# Patient Record
Sex: Female | Born: 1985 | Hispanic: Yes | Marital: Married | Smoking: Never smoker
Health system: Southern US, Community
[De-identification: ages and names within clinical notes are randomized; demographics above are authoritative.]

## PROBLEM LIST (undated history)

## (undated) DIAGNOSIS — Z789 Other specified health status: Secondary | ICD-10-CM

## (undated) HISTORY — PX: NO PAST SURGERIES: SHX2092

## (undated) HISTORY — DX: Other specified health status: Z78.9

---

## 2008-07-28 DIAGNOSIS — A549 Gonococcal infection, unspecified: Secondary | ICD-10-CM

## 2008-07-28 HISTORY — DX: Gonococcal infection, unspecified: A54.9

## 2011-07-29 HISTORY — PX: LEEP: SHX91

## 2019-07-20 LAB — OB RESULTS CONSOLE HGB/HCT, BLOOD
HCT: 39 (ref 29–41)
Hemoglobin: 13.3

## 2019-07-20 LAB — OB RESULTS CONSOLE ABO/RH: RH Type: POSITIVE

## 2019-07-20 LAB — OB RESULTS CONSOLE ANTIBODY SCREEN: Antibody Screen: NEGATIVE

## 2019-07-20 LAB — OB RESULTS CONSOLE RUBELLA ANTIBODY, IGM: Rubella: IMMUNE

## 2019-07-20 LAB — OB RESULTS CONSOLE RPR: RPR: NONREACTIVE

## 2019-07-20 LAB — OB RESULTS CONSOLE PLATELET COUNT: Platelets: 271

## 2019-07-20 LAB — OB RESULTS CONSOLE HEPATITIS B SURFACE ANTIGEN: Hepatitis B Surface Ag: NEGATIVE

## 2019-07-29 NOTE — L&D Delivery Note (Addendum)
OB/GYN Faculty Practice Delivery Note  Kathryn Mack is a 34 y.o. G1P0 s/p vaginal delivery at [redacted]w[redacted]d. She was admitted for SROM- 0700 on 6:30.  ROM: 26h 77m with clear fluid GBS Status: negative Maximum Maternal Temperature: 101.9*F  Labor Progress: . Admitted on 01/25/20 after SROM at 0700. Expectant management with aggressive labor position changes per patient request- she initially wanted a water birth. After >12 hours without cervical change, she was amenable to augmentation with cytotec and FB. She was eventually started on pitocin and received an epidural. She progressed to complete and delivered after a short second stage, developing Triple I just prior to delivery.  Delivery Date/Time: 01/26/20, 1062 Delivery: Called to room and patient was complete and pushing. Head delivered ROA. No nuchal cord present. Shoulder and body delivered in usual fashion. Infant with spontaneous cry, placed on mother's abdomen, dried and stimulated. Cord clamped x 2 after pulsations stopped, and cut by [name] under my direct supervision. Cord blood drawn. Placenta delivered spontaneously with gentle cord traction. Fundus firm with massage and Pitocin. Labia, perineum, vagina, and cervix were inspected, and a first degree laceration was noted-repaired with 3-0 Monocryl in the usual fashion.   Placenta: 3 vessel cord, intact, to pathology for suspected chorio Complications: Baby initial temp 102 Lacerations: 1st degree laceration- repaired with 3-0 Monocryl in the usual fashion EBL: 223 mL Analgesia: epidural  Postpartum Planning [x]  message to sent to schedule follow-up  [x]  vaccines UTD  Infant: female  APGARs 9,9  weight per medical record  , DO OB/GYN Fellow, Faculty Practice

## 2019-12-23 LAB — OB RESULTS CONSOLE HGB/HCT, BLOOD
HCT: 36 (ref 29–41)
Hemoglobin: 12.6

## 2019-12-23 LAB — HIV ANTIBODY (ROUTINE TESTING W REFLEX): HIV Screen 4th Generation wRfx: NONREACTIVE

## 2019-12-23 LAB — OB RESULTS CONSOLE PLATELET COUNT: Platelets: 217

## 2020-01-04 ENCOUNTER — Encounter: Payer: Self-pay | Admitting: General Practice

## 2020-01-09 ENCOUNTER — Encounter: Payer: Self-pay | Admitting: General Practice

## 2020-01-09 ENCOUNTER — Encounter: Payer: Self-pay | Admitting: *Deleted

## 2020-01-09 DIAGNOSIS — Z3400 Encounter for supervision of normal first pregnancy, unspecified trimester: Secondary | ICD-10-CM | POA: Insufficient documentation

## 2020-01-09 HISTORY — DX: Encounter for supervision of normal first pregnancy, unspecified trimester: Z34.00

## 2020-01-09 LAB — AFP TETRA
AFP MoM: 2.06
AFP: NEGATIVE
Down Syndrome: 1
Open Spina bifida: 1

## 2020-01-09 LAB — DIET GESTATIONAL CARB MOD: GTT, 1 hr: 137

## 2020-01-18 ENCOUNTER — Other Ambulatory Visit: Payer: Self-pay

## 2020-01-18 ENCOUNTER — Other Ambulatory Visit (HOSPITAL_COMMUNITY)
Admission: RE | Admit: 2020-01-18 | Discharge: 2020-01-18 | Disposition: A | Discharge status: Home / Self Care | Payer: BLUE CROSS/BLUE SHIELD | Source: Ambulatory Visit | Attending: Advanced Practice Midwife | Admitting: Advanced Practice Midwife

## 2020-01-18 ENCOUNTER — Ambulatory Visit (INDEPENDENT_AMBULATORY_CARE_PROVIDER_SITE_OTHER): Payer: BLUE CROSS/BLUE SHIELD | Admitting: Advanced Practice Midwife

## 2020-01-18 ENCOUNTER — Encounter: Payer: Self-pay | Admitting: Advanced Practice Midwife

## 2020-01-18 DIAGNOSIS — Z3403 Encounter for supervision of normal first pregnancy, third trimester: Secondary | ICD-10-CM | POA: Diagnosis not present

## 2020-01-18 DIAGNOSIS — Z3A36 36 weeks gestation of pregnancy: Secondary | ICD-10-CM | POA: Diagnosis not present

## 2020-01-18 DIAGNOSIS — Z3400 Encounter for supervision of normal first pregnancy, unspecified trimester: Secondary | ICD-10-CM | POA: Diagnosis not present

## 2020-01-18 DIAGNOSIS — Z23 Encounter for immunization: Secondary | ICD-10-CM | POA: Diagnosis not present

## 2020-01-18 NOTE — Progress Notes (Signed)
  List of peds provider tdap today

## 2020-01-18 NOTE — Progress Notes (Signed)
  Subjective:   Kathryn Mack is a 34 y.o. G1P0 at [redacted]w[redacted]d by LMP, midtrimester ultrasound being seen today for her first obstetrical visit.  Her obstetrical history is significant for none. Patient does intend to breast feed. Pregnancy history fully reviewed.  Patient reports no complaints.  HISTORY: OB History  Gravida Para Term Preterm AB Living  1 0 0 0 0 0  SAB TAB Ectopic Multiple Live Births  0 0 0 0 0    # Outcome Date GA Lbr Len/2nd Weight Sex Delivery Anes PTL Lv  1 Current             Last pap smear was done unknown and was unknown. Patient did have LEEP in 2013   Past Medical History:  Diagnosis Date  . Medical history non-contributory    Past Surgical History:  Procedure Laterality Date  . LEEP  2013   History reviewed. No pertinent family history. Social History   Tobacco Use  . Smoking status: Never Smoker  . Smokeless tobacco: Never Used  Vaping Use  . Vaping Use: Never used  Substance Use Topics  . Alcohol use: Not Currently  . Drug use: Never   No Known Allergies No current outpatient medications on file prior to visit.   No current facility-administered medications on file prior to visit.    Review of Systems Pertinent items noted in HPI and remainder of comprehensive ROS otherwise negative.  Exam   Vitals:   01/09/20 0935 01/18/20 1036  BP:  132/84  Pulse:  (!) 101  Weight:  179 lb 9.6 oz (81.5 kg)  Height: 5\' 4"  (1.626 m)    Fetal Heart Rate (bpm): 126  Physical Exam Vitals and nursing note reviewed. Exam conducted with a chaperone present.  Constitutional:      Appearance: Normal appearance.  HENT:     Head: Normocephalic.  Cardiovascular:     Rate and Rhythm: Normal rate.  Pulmonary:     Effort: Pulmonary effort is normal.  Skin:    General: Skin is warm and dry.  Neurological:     Mental Status: She is alert and oriented to person, place, and time.  Psychiatric:        Mood and Affect: Mood normal.   FH 36 cm CERVIX:  1/70/-2  Assessment:   Pregnancy: G1P0 Patient Active Problem List   Diagnosis Date Noted  . Supervision of normal first pregnancy, antepartum 01/09/2020     Plan:  1. Supervision of normal first pregnancy, antepartum - Culture, beta strep (group b only) - GC/Chlamydia probe amp ([place])not at [facility] - Hepatitis C Antibody - Info given for water birth class. Will have her sign consent at next visit if she is able to do a phone class with [facility]. CNM visit today, and patient it ok for water birth pending class.    Initial labs reviewed Hep C added  Continue prenatal vitamins. Genetic Screening discussed, Quad screen: results reviewed. Ultrasound discussed; fetal anatomic survey: results reviewed. Problem list reviewed and updated. The nature of [place] [facility] Faculty Practice with multiple MDs and other Advanced Practice Providers was explained to patient; also emphasized that residents, students are part of our team. Routine obstetric precautions reviewed. 50% of 45 min visit spent in counseling and coordination of care. Return in about 1 week (around 01/25/2020).   01/27/2020 DNP, CNM  01/18/20  11:21 AM

## 2020-01-18 NOTE — Patient Instructions (Addendum)
Considering Waterbirth? Guide for patients at [facility] [employer] [facility] Why consider waterbirth? . Gentle birth for babies  . Less pain medicine used in labor  . May allow for passive descent/less pushing  . May reduce perineal tears  . More mobility and instinctive maternal position changes  . Increased maternal relaxation  . Reduced blood pressure in labor   Is waterbirth safe? What are the risks of infection, drowning or other complications? . Infection:  Marland Kitchen Very low risk (3.7 % for tub vs 4.8% for bed)  . 7 in 8000 waterbirths with documented infection  . Poorly cleaned equipment most common cause  . Slightly lower group B strep transmission rate  . Drowning  . Maternal:  . Very low risk  . Related to seizures or fainting  . Newborn:  Marland Kitchen Very low risk. No evidence of increased risk of respiratory problems in multiple large studies  . Physiological protection from breathing under water  . Avoid underwater birth if there are any fetal complications  . Once baby's head is out of the water, keep it out.  . Birth complication  . Some reports of cord trauma, but risk decreased by bringing baby to surface gradually  . No evidence of increased risk of shoulder dystocia. Mothers can usually change positions faster in water than in a bed, possibly aiding the maneuvers to free the shoulder.  ? You must attend a Waterbirth class at [employer] at [facility] . 3rd Wednesday of every month from 7-9pm  . Free  . Register by calling [phone] Paulene Floor  . Bring [place] the certificate from the class to your prenatal appointment  Meet with a midwife at 36 weeks to see if you can still plan a waterbirth and to sign the consent.   If you plan a waterbirth at [facility], you can opt to purchase the following: . Fish Net . Bathing suit top (optional)  . Long-handled mirror (optional)  .  Things that would prevent you from having a  waterbirth: . Unknown or Positive COVID-19 diagnosis upon admission to hospital  . Premature, <37wks  . Previous cesarean birth  . Presence of thick meconium-stained fluid  . Multiple gestation (Twins, triplets, etc.)  . Uncontrolled diabetes or gestational diabetes requiring medication  . Hypertension requiring medication or diagnosis of pre-eclampsia  . Heavy vaginal bleeding  . Non-reassuring fetal heart rate  . Active infection (MRSA, etc.). Group B Strep is NOT a contraindication for waterbirth.  . If your labor has to be induced and induction method requires continuous monitoring of the baby's heart rate  . Other risks/issues identified by your obstetrical provider  Please remember that birth is unpredictable. Under certain unforeseeable circumstances your provider may advise against giving birth in the tub. These decisions will be made on a case-by-case basis and with the safety of you and your baby as our highest priority.  **Please remember that in order to have a waterbirth, you must test Negative to COVID-19 upon admission to the hospital.**

## 2020-01-19 LAB — GC/CHLAMYDIA PROBE AMP (~~LOC~~) NOT AT ARMC
Chlamydia: NEGATIVE
Comment: NEGATIVE
Comment: NORMAL
Neisseria Gonorrhea: NEGATIVE

## 2020-01-19 LAB — HEPATITIS C ANTIBODY: Hep C Virus Ab: <0.1 s/co ratio (ref 0.0–0.9)

## 2020-01-22 LAB — CULTURE, BETA STREP (GROUP B ONLY): Strep Gp B Culture: NEGATIVE

## 2020-01-25 ENCOUNTER — Inpatient Hospital Stay (HOSPITAL_COMMUNITY)
Admission: AD | Admit: 2020-01-25 | Discharge: 2020-01-28 | DRG: 805 | Disposition: A | Discharge status: Home / Self Care | Payer: BLUE CROSS/BLUE SHIELD | Attending: Obstetrics and Gynecology | Admitting: Obstetrics and Gynecology

## 2020-01-25 ENCOUNTER — Encounter (HOSPITAL_COMMUNITY): Payer: Self-pay | Admitting: Obstetrics and Gynecology

## 2020-01-25 ENCOUNTER — Other Ambulatory Visit: Payer: Self-pay

## 2020-01-25 ENCOUNTER — Telehealth: Payer: Self-pay | Admitting: *Deleted

## 2020-01-25 ENCOUNTER — Encounter: Payer: BLUE CROSS/BLUE SHIELD | Admitting: Student

## 2020-01-25 DIAGNOSIS — Z3A37 37 weeks gestation of pregnancy: Secondary | ICD-10-CM

## 2020-01-25 DIAGNOSIS — O411230 Chorioamnionitis, third trimester, not applicable or unspecified: Secondary | ICD-10-CM | POA: Diagnosis present

## 2020-01-25 DIAGNOSIS — Z3400 Encounter for supervision of normal first pregnancy, unspecified trimester: Secondary | ICD-10-CM

## 2020-01-25 DIAGNOSIS — Z20822 Contact with and (suspected) exposure to covid-19: Secondary | ICD-10-CM | POA: Diagnosis present

## 2020-01-25 DIAGNOSIS — O4202 Full-term premature rupture of membranes, onset of labor within 24 hours of rupture: Secondary | ICD-10-CM | POA: Diagnosis not present

## 2020-01-25 DIAGNOSIS — O26893 Other specified pregnancy related conditions, third trimester: Secondary | ICD-10-CM | POA: Diagnosis not present

## 2020-01-25 DIAGNOSIS — O4292 Full-term premature rupture of membranes, unspecified as to length of time between rupture and onset of labor: Secondary | ICD-10-CM | POA: Diagnosis not present

## 2020-01-25 HISTORY — DX: Full-term premature rupture of membranes, unspecified as to length of time between rupture and onset of labor: O42.92

## 2020-01-25 LAB — TYPE AND SCREEN
ABO/RH(D): O POS
Antibody Screen: NEGATIVE

## 2020-01-25 LAB — SARS CORONAVIRUS 2 BY RT PCR (HOSPITAL ORDER, PERFORMED IN ~~LOC~~ HOSPITAL LAB): SARS Coronavirus 2: NEGATIVE

## 2020-01-25 LAB — CBC
HCT: 38.4 % (ref 36.0–46.0)
Hemoglobin: 13 g/dL (ref 12.0–15.0)
MCH: 30.8 pg (ref 26.0–34.0)
MCHC: 33.9 g/dL (ref 30.0–36.0)
MCV: 91 fL (ref 80.0–100.0)
Platelets: 223 10*3/uL (ref 150–400)
RBC: 4.22 MIL/uL (ref 3.87–5.11)
RDW: 13.2 % (ref 11.5–15.5)
WBC: 9 10*3/uL (ref 4.0–10.5)
nRBC: 0 % (ref 0.0–0.2)

## 2020-01-25 LAB — POCT FERN TEST: POCT Fern Test: POSITIVE

## 2020-01-25 LAB — ABO/RH: ABO/RH(D): O POS

## 2020-01-25 LAB — RPR: RPR Ser Ql: NONREACTIVE

## 2020-01-25 MED ORDER — ACETAMINOPHEN 325 MG PO TABS: 650.0000 mg | ORAL_TABLET | ORAL | Status: DC | PRN

## 2020-01-25 MED ORDER — MISOPROSTOL 50MCG HALF TABLET
ORAL_TABLET | ORAL | Status: AC
  Filled 2020-01-25: qty 1

## 2020-01-25 MED ORDER — LACTATED RINGERS IV SOLN: INTRAVENOUS | Status: DC

## 2020-01-25 MED ORDER — OXYTOCIN-SODIUM CHLORIDE 30-0.9 UT/500ML-% IV SOLN: 2.5000 [IU]/h | INTRAVENOUS | Status: DC

## 2020-01-25 MED ORDER — OXYTOCIN BOLUS FROM INFUSION
333.0000 mL | Freq: Once | INTRAVENOUS | Status: AC
  Administered 2020-01-26: 333 mL via INTRAVENOUS

## 2020-01-25 MED ORDER — SOD CITRATE-CITRIC ACID 500-334 MG/5ML PO SOLN: 30.0000 mL | ORAL | Status: DC | PRN

## 2020-01-25 MED ORDER — ZOLPIDEM TARTRATE 5 MG PO TABS: 5.0000 mg | ORAL_TABLET | Freq: Every evening | ORAL | Status: DC | PRN

## 2020-01-25 MED ORDER — ONDANSETRON HCL 4 MG/2ML IJ SOLN
4.0000 mg | Freq: Four times a day (QID) | INTRAMUSCULAR | Status: DC | PRN
  Filled 2020-01-25: qty 2

## 2020-01-25 MED ORDER — LACTATED RINGERS IV SOLN: 500.0000 mL | INTRAVENOUS | Status: DC | PRN

## 2020-01-25 MED ORDER — LIDOCAINE HCL (PF) 1 % IJ SOLN: 30.0000 mL | INTRAMUSCULAR | Status: DC | PRN

## 2020-01-25 MED ORDER — MISOPROSTOL 50MCG HALF TABLET
50.0000 ug | ORAL_TABLET | Freq: Once | ORAL | Status: AC
  Administered 2020-01-25: 50 ug via BUCCAL

## 2020-01-25 NOTE — Progress Notes (Signed)
Patient ID: Kathryn Mack, female   DOB: November 10, 1985, 34 y.o.   MRN: [id number]  In to introduce myself to pt; she is wanting to discuss plan of care; states she is open to various IOL/augmentation methods as well as pain management options in addition to still considering waterbirth depending on how she feels  BP 135/89, 111/73, P 93 FHR 130s, +accels, no decels Ctx irreg, mild Cx 1/80/vtx -2; leaking clear fluid  IUP@37 .2wks PROM x 14h GBS neg Cx unfavorable  -Reviewed various methods for IOL/augmentation prior to exam> cx was still unfavorable and so she agreed to a cervical foley placement & buccal cytotec -Will reeval in 4hrs; if foley still in place will give a 2nd dose of cytotec -Discussed that if Pitocin is required for labor, she will not be able to get in the tub, but if we can keep labor going spontaneously, then waterbirth is still an option -Anticipate vag del  Arabella Merles CNM 01/25/2020

## 2020-01-25 NOTE — Progress Notes (Signed)
Kathryn Mack is a 34 y.o. G1P0 at [redacted]w[redacted]d   Subjective: Denies pain, just finished lunch. Verbalizes to CNM she feels ready to get out of bed. Continues to desire unmedicated labor and waterbirth.  Objective: BP 131/80   Pulse (!) 118   Temp 98.7 F (37.1 C) (Oral)   Resp 18   Ht 5\' 4"  (1.626 m)   Wt 85.7 kg   LMP 05/09/2019   SpO2 100%   BMI 32.44 kg/m  No intake/output data recorded. No intake/output data recorded.  FHT:  FHR: 135 bpm, variability: moderate,  accelerations:  Present,  decelerations:  Absent UC:   Rare, not felt by patient SVE:   Dilation: 1 Effacement (%): 60 Station: -1 Exam by:: [email or web address]  Labs: Lab Results  Component Value Date   WBC 9.0 01/25/2020   HGB 13.0 01/25/2020   HCT 38.4 01/25/2020   MCV 91.0 01/25/2020   PLT 223 01/25/2020    Assessment / Plan: --Initiate position changes q 45 minutes --Continue intermittent monitoring --Encouraged use of birthing ball, side lunges --Discussed initiating nipple stim for augmentation --Anticipate NSVD  01/27/2020, CNM 01/25/2020, 2:34 PM

## 2020-01-25 NOTE — Progress Notes (Signed)
Report given to K. Clelia Croft, CNM who assumes care of patient at this time.  Clayton Bibles, MSN, CNM Certified Nurse Midwife, Faculty Practice 01/25/20 6:02 PM

## 2020-01-25 NOTE — Progress Notes (Signed)
Sam Weinhold CNM and Santina Evans Janney Priego RN repeatedly encouraging patient to get up and do nipple stimulation and birthing ball. Patient continues to sleep.

## 2020-01-25 NOTE — Telephone Encounter (Signed)
Patient called stating she feels like she is urinating on herself. It started about 1 hour ago and now she is still leaking fluid. Advised patient that she will need to be seen at [facility]U for further evaluation.  Clovis Pu, RN

## 2020-01-25 NOTE — Progress Notes (Signed)
Kathryn Mack is a 34 y.o. G1P0 at [redacted]w[redacted]d   Subjective: Denies pain, requests meal and nap prior to labor circuit. Has just ordered lunch tray. [name] Darnell inbound from home.  Objective: BP 131/80   Pulse (!) 118   Temp 99 F (37.2 C) (Oral)   Resp 16   Ht 5\' 4"  (1.626 m)   Wt 85.7 kg   LMP 05/09/2019   SpO2 100%   BMI 32.44 kg/m  No intake/output data recorded. No intake/output data recorded.  FHT:  FHR: 135 bpm, variability: moderate,  accelerations:  Present,  decelerations:  Absent UC:   none SVE:   Dilation: 1 Effacement (%): 60 Station: -1 Exam by:: [email or web address]  Labs: Lab Results  Component Value Date   WBC 9.0 01/25/2020   HGB 13.0 01/25/2020   HCT 38.4 01/25/2020   MCV 91.0 01/25/2020   PLT 223 01/25/2020    Assessment / Plan: --S/p SROM 0700 today  --Cat I tracing --At bedside to sign waterbirth consent, review contraindications --Patient to eat, take 60 minute nap, then start q 45 minute position changes --Revisited filling tub when 6cm with consistent contraction pattern. Pt amenable --Anticipate NSVD  01/27/2020, CNM 01/25/2020, 12:59 PM

## 2020-01-25 NOTE — Progress Notes (Signed)
Kathryn Mack is a 34 y.o. G1P0 at [redacted]w[redacted]d   Subjective: Patient sleeping soundly on my entry to room. Denies contraction pain. Falling asleep during our conversation. Verbalizes that her doula lives in [place] and will not be at bedside.   Objective: BP 131/80   Pulse (!) 118   Temp 98.7 F (37.1 C) (Oral)   Resp 18   Ht 5\' 4"  (1.626 m)   Wt 85.7 kg   LMP 05/09/2019   SpO2 100%   BMI 32.44 kg/m  No intake/output data recorded. No intake/output data recorded.  FHT:  FHR: 130 bpm, variability: moderate,  accelerations:  Present,  decelerations:  Absent UC:  Rare, UI SVE:   Dilation: 1 Effacement (%): 60 Station: -1 Exam by:: [email or web address]  Labs: Lab Results  Component Value Date   WBC 9.0 01/25/2020   HGB 13.0 01/25/2020   HCT 38.4 01/25/2020   MCV 91.0 01/25/2020   PLT 223 01/25/2020    Assessment / Plan: --34 y.o. G1P0 at [redacted]w[redacted]d  --S/p SROM 0700 today ~ 10.5 hours ago --Cat I tracing --Afebrile and normotensive --At bedside to discuss persistent request for sleep since admission. Given duration of ROM repeated discussion of nipple stim and position changes vs Pitocin. Patient verbalizes interest in Pitocin to let me sleep some more". Discussed trial of nipple stim and q 45 minute position changes, reevaluate for Pitocin on night shift. Pt agreeable --Report given to Dr. [redacted]w[redacted]d, oncoming attending.   Despina Hidden, CNM 01/25/2020, 5:25 PM

## 2020-01-25 NOTE — H&P (Addendum)
Kathryn Mack is a 34 y.o. female presenting for SROM at approximately 0700 today . She denies pain, vaginal bleeding, leaking of fluid, decreased fetal movement, fever, falls, or recent illness.   Prenatal History --Transfer of care from [place] to [facility] at [redacted]w[redacted]d --Records scanned into Media tab (dated 01/04/2020) --Dating by LMP --Negative AFP --Rubella Immune  OB History    Gravida  1   Para      Term      Preterm      AB      Living        SAB      TAB      Ectopic      Multiple      Live Births             Nursing Staff Provider  Office Location  [facility] Dating  LMP 05/09/19  Language  English Anatomy US  Normal per scanned records   Flu Vaccine   Genetic Screen  Quad: screen negative   TDaP vaccine   01/18/2020 Hgb A1C or  GTT Early  Third trimester   Rhogam  N/A   LAB RESULTS     Blood Type O/Positive/-- (12/23 0000)   Feeding Plan Breast Antibody Negative (12/23 0000)  Contraception Undecided Rubella Immune (12/23 0000)  Circumcision yes RPR Nonreactive (12/23 0000)   Pediatrician  Undecided HBsAg Negative (12/23 0000)   Support Person Husband:  Darnell HCVAb  Negative  Prenatal Classes No HIV     Negative   BTL Consent N/A GBS  Negative   VBAC Consent N/A Pap Unknown, will need PP     Hgb Electro    BP Cuff  CF Negative    SMA     Waterbirth  [x]  Class [ ]  Consent [x]  CNM visit         Past Medical History:  Diagnosis Date  . Gonorrhea 2010  . Medical history non-contributory    Past Surgical History:  Procedure Laterality Date  . LEEP  2013  . NO PAST SURGERIES     Family History: family history includes Arthritis in her father; Hypertension in her father. Social History:  reports that she has never smoked. She has never used smokeless tobacco. She reports previous alcohol use. She reports that she does not use drugs.     Maternal Diabetes: No Genetic Screening: Normal Maternal Ultrasounds/Referrals:  Normal Fetal Ultrasounds or other Referrals:  None Maternal Substance Abuse:  No Significant Maternal Medications:  None Significant Maternal Lab Results:  Group B Strep negative Other Comments:  None  Review of Systems  Constitutional: Positive for fatigue.  Gastrointestinal: Negative for abdominal pain.  Genitourinary: Negative for vaginal bleeding.  Musculoskeletal: Negative for back pain.  All other systems reviewed and are negative.  Dilation: 1 Effacement (%): 60 Station: -1 Exam by:: 2011 Blood pressure 129/84, pulse 96, temperature 98.2 F (36.8 C), temperature source Oral, resp. rate 18, height 5\' 4"  (1.626 m), weight 85.8 kg, last menstrual period 05/09/2019, SpO2 100 %. Physical Exam Vitals and nursing note reviewed. Exam conducted with a chaperone present.  Constitutional:      Appearance: Normal appearance.  Cardiovascular:     Rate and Rhythm: Normal rate.     Pulses: Normal pulses.     Heart sounds: Normal heart sounds.  Pulmonary:     Effort: Pulmonary effort is normal.     Breath sounds: Normal breath sounds.  Abdominal:     Comments: Gravid  Skin:    General: Skin is warm and dry.     Capillary Refill: Capillary refill takes less than 2 seconds.  Neurological:     Mental Status: She is alert.  Psychiatric:        Mood and Affect: Mood normal.        Behavior: Behavior normal.     Prenatal labs: ABO, Rh: O/Positive/-- (12/23 0000) Antibody: Negative (12/23 0000) Rubella: Immune (12/23 0000) RPR: Nonreactive (12/23 0000)  HBsAg: Negative (12/23 0000)  HIV: Non Reactive (05/28 1024)  GBS: Negative/-- (06/23 1134)   Assessment/Plan: --34 y.o. G1P0 at [redacted]w[redacted]d  --Grossly ruptured 0700 today --Pain score 0/10 --GBS Neg --Cat I tracing --Birth plan reviewed with patient in MAU --Doula engaged via Facetime, encouraged patient to request doula at bedside --Desires waterbirth, s/p class and CNM visit --Will sign waterbirth consent with patient  once settled on L&D --Discussed [employer], possible nipple stim to complement expectant management --Admit to L&D, expectant management per patient preference   Calvert Cantor, CNM 01/25/2020, 11:51 AM

## 2020-01-25 NOTE — MAU Note (Signed)
Patient woke up at 0630 and thought she had urinated herself. Patient showered and continued to leak. No contractions or bleeding and has been feeling the baby move

## 2020-01-26 ENCOUNTER — Inpatient Hospital Stay (HOSPITAL_COMMUNITY): Payer: BLUE CROSS/BLUE SHIELD | Admitting: Anesthesiology

## 2020-01-26 ENCOUNTER — Encounter (HOSPITAL_COMMUNITY): Payer: Self-pay | Admitting: Obstetrics and Gynecology

## 2020-01-26 ENCOUNTER — Encounter: Payer: BLUE CROSS/BLUE SHIELD | Admitting: Obstetrics and Gynecology

## 2020-01-26 DIAGNOSIS — O4202 Full-term premature rupture of membranes, onset of labor within 24 hours of rupture: Secondary | ICD-10-CM

## 2020-01-26 DIAGNOSIS — Z3A37 37 weeks gestation of pregnancy: Secondary | ICD-10-CM

## 2020-01-26 MED ORDER — LACTATED RINGERS IV SOLN: 500.0000 mL | Freq: Once | INTRAVENOUS | Status: DC

## 2020-01-26 MED ORDER — COCONUT OIL OIL: 1.0000 "application " | TOPICAL_OIL | Status: DC | PRN

## 2020-01-26 MED ORDER — DIPHENHYDRAMINE HCL 50 MG/ML IJ SOLN: 12.5000 mg | INTRAMUSCULAR | Status: DC | PRN

## 2020-01-26 MED ORDER — TERBUTALINE SULFATE 1 MG/ML IJ SOLN: 0.2500 mg | Freq: Once | INTRAMUSCULAR | Status: DC | PRN

## 2020-01-26 MED ORDER — SODIUM CHLORIDE (PF) 0.9 % IJ SOLN
INTRAMUSCULAR | Status: DC | PRN
  Administered 2020-01-26: 12 mL/h via EPIDURAL

## 2020-01-26 MED ORDER — ONDANSETRON HCL 4 MG/2ML IJ SOLN: 4.0000 mg | INTRAMUSCULAR | Status: DC | PRN

## 2020-01-26 MED ORDER — WITCH HAZEL-GLYCERIN EX PADS: 1.0000 "application " | MEDICATED_PAD | CUTANEOUS | Status: DC | PRN

## 2020-01-26 MED ORDER — FENTANYL CITRATE (PF) 100 MCG/2ML IJ SOLN: 100.0000 ug | INTRAMUSCULAR | Status: DC | PRN

## 2020-01-26 MED ORDER — ACETAMINOPHEN 325 MG PO TABS: 650.0000 mg | ORAL_TABLET | ORAL | Status: DC | PRN

## 2020-01-26 MED ORDER — SODIUM CHLORIDE 0.9 % IV SOLN: INTRAVENOUS | Status: DC | PRN

## 2020-01-26 MED ORDER — EPHEDRINE 5 MG/ML INJ: 10.0000 mg | INTRAVENOUS | Status: DC | PRN

## 2020-01-26 MED ORDER — SODIUM CHLORIDE 0.9 % IV SOLN
2.0000 g | Freq: Once | INTRAVENOUS | Status: AC
  Administered 2020-01-26: 2 g via INTRAVENOUS
  Filled 2020-01-26: qty 2000

## 2020-01-26 MED ORDER — DIBUCAINE (PERIANAL) 1 % EX OINT: 1.0000 "application " | TOPICAL_OINTMENT | CUTANEOUS | Status: DC | PRN

## 2020-01-26 MED ORDER — FENTANYL CITRATE (PF) 100 MCG/2ML IJ SOLN
INTRAMUSCULAR | Status: AC
  Administered 2020-01-26: 100 ug via INTRAVENOUS
  Filled 2020-01-26: qty 2

## 2020-01-26 MED ORDER — PHENYLEPHRINE 40 MCG/ML (10ML) SYRINGE FOR IV PUSH (FOR BLOOD PRESSURE SUPPORT): 80.0000 ug | PREFILLED_SYRINGE | INTRAVENOUS | Status: DC | PRN

## 2020-01-26 MED ORDER — OXYTOCIN-SODIUM CHLORIDE 30-0.9 UT/500ML-% IV SOLN
1.0000 m[IU]/min | INTRAVENOUS | Status: DC
  Administered 2020-01-26: 2 m[IU]/min via INTRAVENOUS
  Filled 2020-01-26: qty 500

## 2020-01-26 MED ORDER — TETANUS-DIPHTH-ACELL PERTUSSIS 5-2.5-18.5 LF-MCG/0.5 IM SUSP: 0.5000 mL | Freq: Once | INTRAMUSCULAR | Status: DC

## 2020-01-26 MED ORDER — FENTANYL-BUPIVACAINE-NACL 0.5-0.125-0.9 MG/250ML-% EP SOLN
12.0000 mL/h | EPIDURAL | Status: DC | PRN
  Filled 2020-01-26: qty 250

## 2020-01-26 MED ORDER — SIMETHICONE 80 MG PO CHEW: 80.0000 mg | CHEWABLE_TABLET | ORAL | Status: DC | PRN

## 2020-01-26 MED ORDER — GENTAMICIN SULFATE 40 MG/ML IJ SOLN
5.0000 mg/kg | INTRAVENOUS | Status: DC
  Administered 2020-01-26: 340 mg via INTRAVENOUS
  Filled 2020-01-26: qty 8.5

## 2020-01-26 MED ORDER — DIPHENHYDRAMINE HCL 25 MG PO CAPS: 25.0000 mg | ORAL_CAPSULE | Freq: Four times a day (QID) | ORAL | Status: DC | PRN

## 2020-01-26 MED ORDER — SENNOSIDES-DOCUSATE SODIUM 8.6-50 MG PO TABS
2.0000 | ORAL_TABLET | ORAL | Status: DC
  Administered 2020-01-27 (×2): 2 via ORAL
  Filled 2020-01-26 (×2): qty 2

## 2020-01-26 MED ORDER — ONDANSETRON HCL 4 MG PO TABS: 4.0000 mg | ORAL_TABLET | ORAL | Status: DC | PRN

## 2020-01-26 MED ORDER — LIDOCAINE HCL (PF) 1 % IJ SOLN
INTRAMUSCULAR | Status: DC | PRN
  Administered 2020-01-26: 10 mL via EPIDURAL

## 2020-01-26 MED ORDER — IBUPROFEN 600 MG PO TABS
600.0000 mg | ORAL_TABLET | Freq: Four times a day (QID) | ORAL | Status: DC
  Administered 2020-01-26 – 2020-01-28 (×10): 600 mg via ORAL
  Filled 2020-01-26 (×10): qty 1

## 2020-01-26 MED ORDER — BENZOCAINE-MENTHOL 20-0.5 % EX AERO
1.0000 "application " | INHALATION_SPRAY | CUTANEOUS | Status: DC | PRN
  Administered 2020-01-26: 1 via TOPICAL
  Filled 2020-01-26: qty 56

## 2020-01-26 MED ORDER — ZOLPIDEM TARTRATE 5 MG PO TABS: 5.0000 mg | ORAL_TABLET | Freq: Every evening | ORAL | Status: DC | PRN

## 2020-01-26 MED ORDER — PRENATAL MULTIVITAMIN CH
1.0000 | ORAL_TABLET | Freq: Every day | ORAL | Status: DC
  Administered 2020-01-26 – 2020-01-28 (×3): 1 via ORAL
  Filled 2020-01-26 (×3): qty 1

## 2020-01-26 NOTE — Anesthesia Postprocedure Evaluation (Signed)
Anesthesia Post Note  Patient: Kathryn Mack  Procedure(s) Performed: AN AD HOC LABOR EPIDURAL     Patient location during evaluation: Mother Baby Anesthesia Type: Epidural Level of consciousness: awake and alert Pain management: pain level controlled Vital Signs Assessment: post-procedure vital signs reviewed and stable Respiratory status: spontaneous breathing, nonlabored ventilation and respiratory function stable Cardiovascular status: stable Postop Assessment: no headache, no backache and epidural receding Anesthetic complications: no   No complications documented.  Last Vitals:  Vitals:   01/26/20 1330 01/26/20 1615  BP:  128/88  Pulse: (!) 129 99  Resp:  20  Temp: 36.7 C 37.1 C  SpO2:  99%    Last Pain:  Vitals:   01/26/20 1615  TempSrc: Oral  PainSc: 3    Pain Goal:                Epidural/Spinal Function Cutaneous sensation: Normal sensation (01/26/20 1615), Patient able to flex knees: Yes (01/26/20 1615), Patient able to lift hips off bed: Yes (01/26/20 1615), Back pain beyond tenderness at insertion site: No (01/26/20 1615), Progressively worsening motor and/or sensory loss: No (01/26/20 1615), Bowel and/or bladder incontinence post epidural: No (01/26/20 1615)  Uziel Covault

## 2020-01-26 NOTE — Anesthesia Preprocedure Evaluation (Signed)

## 2020-01-26 NOTE — Anesthesia Procedure Notes (Signed)
Epidural Patient location during procedure: OB Start time: 01/26/2020 2:52 AM End time: 01/26/2020 3:00 AM  Staffing Anesthesiologist: Lucretia Kern, MD Performed: anesthesiologist   Preanesthetic Checklist Completed: patient identified, IV checked, risks and benefits discussed, monitors and equipment checked, pre-op evaluation and timeout performed  Epidural Patient position: sitting Prep: DuraPrep Patient monitoring: heart rate, continuous pulse ox and blood pressure Approach: midline Location: L3-L4 Injection technique: LOR air  Needle:  Needle type: Tuohy  Needle gauge: 17 G Needle length: 9 cm Needle insertion depth: 7 cm Catheter type: closed end flexible Catheter size: 19 Gauge Catheter at skin depth: 12 cm Test dose: negative  Assessment Events: blood not aspirated, injection not painful, no injection resistance, no paresthesia and negative IV test  Additional Notes Reason for block:procedure for pain

## 2020-01-26 NOTE — Progress Notes (Signed)
Cat I strip  Complete and pushing Anticipate SVD  Dayle Sherpa, Margarette Asal, DO OB Fellow, Faculty Practice 01/26/2020 9:16 AM

## 2020-01-26 NOTE — Progress Notes (Signed)
Patient ID: Kathryn Mack, female   DOB: 01-28-1986, 34 y.o.   MRN: [id number]  Mostly comfortable w/ epidural; feeling a little pressure  BP 116/73, 109/69 (137/114 with shaking) FHR 135-140, +accels, occ mi variables Ctx q 1-3 mins with Pit at 13mu/min Cx was 8/90/vtx 0 at 0320 per RN  IUP@37 .3wks ROM Active labor/transition  -Plan to check cx in next hour to assess for change -Anticipate vag del  Arabella Merles [facility] 01/26/2020

## 2020-01-26 NOTE — Progress Notes (Signed)
ANTIBIOTIC CONSULT NOTE - INITIAL  Pharmacy Consult for Gentamicin Indication: Chorioamnionitis  No Known Allergies  Patient Measurements: Height: 5\' 4"  (162.6 cm) Weight: 85.7 kg (189 lb) IBW/kg (Calculated) : 54.7 Adjusted Body Weight:  67.1 kg  Vital Signs: Temp: 100.1 F (37.8 C) (07/01 1031) Temp Source: Oral (07/01 1031) BP: 120/71 (07/01 1045) Pulse Rate: 133 (07/01 1045)  Labs: Recent Labs    01/25/20 1117  WBC 9.0  HGB 13.0  PLT 223      Microbiology: Recent Results (from the past 720 hour(s))  Culture, beta strep (group b only)     Status: None   Collection Time: 01/18/20 11:34 AM   Specimen: Vaginal/Rectal; Genital   VR  Result Value Ref Range Status   Strep Gp B Culture Negative Negative Final    Comment: Centers for Disease Control and Prevention (CDC) and American Congress of Obstetricians and Gynecologists (ACOG) guidelines for prevention of perinatal group B streptococcal (GBS) disease specify co-collection of a vaginal and rectal swab specimen to maximize sensitivity of GBS detection. Per the CDC and ACOG, swabbing both the lower vagina and rectum substantially increases the yield of detection compared with sampling the vagina alone. Penicillin G, ampicillin, or cefazolin are indicated for intrapartum prophylaxis of perinatal GBS colonization. Reflex susceptibility testing should be performed prior to use of clindamycin only on GBS isolates from penicillin-allergic women who are considered a high risk for anaphylaxis. Treatment with vancomycin without additional testing is warranted if resistance to clindamycin is noted.   SARS Coronavirus 2 by RT PCR (hospital order, performed in [facility] hospital lab) Nasopharyngeal Nasopharyngeal Swab     Status: None   Collection Time: 01/25/20 11:20 AM   Specimen: Nasopharyngeal Swab  Result Value Ref Range Status   SARS Coronavirus 2 NEGATIVE NEGATIVE Final    Comment: (NOTE) SARS-CoV-2 target  nucleic acids are NOT DETECTED.  The SARS-CoV-2 RNA is generally detectable in upper and lower respiratory specimens during the acute phase of infection. The lowest concentration of SARS-CoV-2 viral copies this assay can detect is 250 copies / mL. A negative result does not preclude SARS-CoV-2 infection and should not be used as the sole basis for treatment or other patient management decisions.  A negative result may occur with improper specimen collection / handling, submission of specimen other than nasopharyngeal swab, presence of viral mutation(s) within the areas targeted by this assay, and inadequate number of viral copies (<250 copies / mL). A negative result must be combined with clinical observations, patient history, and epidemiological information.  Fact Sheet for Patients:   01/27/20  Fact Sheet for Healthcare Providers: BoilerBrush.com.cy  This test is not yet approved or  cleared by the https://pope.com/ FDA and has been authorized for detection and/or diagnosis of SARS-CoV-2 by FDA under an Emergency Use Authorization (EUA).  This EUA will remain in effect (meaning this test can be used) for the duration of the COVID-19 declaration under Section 564(b)(1) of the Act, 21 U.S.C. section 360bbb-3(b)(1), unless the authorization is terminated or revoked sooner.  Performed at [facility], [address] [place], [place]     Medications:  Ampicillin 2grams  Assessment: 34 y.o. female G1P1001 at [redacted]w[redacted]d who developed fever during delivery.  Plan:  Gentamicin 340 mg (5 mg/kg) IV every 24 hrs  Check Scr with next labs if gentamicin continued. Will check gentamicin levels if continued > 72hr or clinically indicated.  [redacted]w[redacted]d 01/26/2020,10:56 AM

## 2020-01-26 NOTE — Discharge Summary (Signed)
Postpartum Discharge Summary    Patient Name: Kathryn Mack DOB: 1986/07/24 MRN: [id number]  Date of admission: 01/25/2020 Delivery date:01/26/2020  Delivering provider: Merilyn Baba  Date of discharge: 01/28/2020  Admitting diagnosis: Full-term premature rupture of membranes [O42.92] Intrauterine pregnancy: [redacted]w[redacted]d    Secondary diagnosis:  Active Problems:   Supervision of normal first pregnancy, antepartum   Full-term premature rupture of membranes  Additional problems: Triple I    Discharge diagnosis: Term Pregnancy Delivered                                              Post partum procedures: None Augmentation: Pitocin, Cytotec and IP Foley Complications: Intrauterine Inflammation or infection (Chorioamniotis)  Hospital course: Onset of Labor With Vaginal Delivery      34y.o. yo G1P0 at 374w3das admitted for SROM on 01/25/2020. Patient had an uncomplicated labor course as follows:   Admitted on 01/25/20 after SROM at 0700. Expectant management with aggressive labor position changes per patient request- she initially wanted a water birth. After >12 hours without cervical change, she was amenable to augmentation with cytotec and FB. She was eventually started on pitocin and received an epidural. She progressed to complete and delivered after a short second stage, developing Triple I just prior to delivery.  Membrane Rupture Time/Date: 6:30 AM ,01/25/2020   Delivery Method:Vaginal, Spontaneous  Episiotomy: None  Lacerations:  1st degree;Periurethral   Immediately after delivery her temperature was 101.9*F. She remained persistently tachycardic and her temperature remained just over 100*F, so one dose of ampicillin was given along with gentamycin.  Patient had an otherwise uncomplicated postpartum course.  She is ambulating, tolerating a regular diet, passing flatus, and urinating well. Patient is discharged home in stable condition on 01/28/20.  Newborn Data: Birth  date:01/26/2020  Birth time:9:22 AM  Gender:Female  Living status:Living  Apgars:9 ,9  [phone]   Magnesium Sulfate received: No BMZ received: No Rhophylac:N/A MMR:N/A T-DaP:Given prenatally Flu: N/A Transfusion:No  Physical exam  Vitals:   01/27/20 0528 01/27/20 1542 01/27/20 2229 01/28/20 0417  BP:  120/85 123/89 123/79  Pulse:  82 99 81  Resp:  _0 Temp:  98.1 F (36.7 C) 97.8 F (36.6 C) 97.8 F (36.6 C)  TempSrc:  Oral Oral Oral  SpO2: 99% 100%    Weight:      Height:       General: alert, cooperative and no distress Lochia: appropriate Uterine Fundus: firm Incision: N/A DVT Evaluation: No evidence of DVT seen on physical exam. No significant calf/ankle edema. Labs: Lab Results  Component Value Date   WBC 25.6 (H) 01/27/2020   HGB 11.6 (L) 01/27/2020   HCT 34.3 (L) 01/27/2020   MCV 90.7 01/27/2020   PLT 204 01/27/2020   No flowsheet data found. Edinburgh Score: Edinburgh Postnatal Depression Scale Screening Tool 01/26/2020  I have been able to laugh and see the funny side of things. (No Data)     After visit meds:  Allergies as of 01/28/2020   No Known Allergies     Medication List    TAKE these medications   acetaminophen 325 MG tablet Commonly known as: Tylenol Take 2 tablets (650 mg total) by mouth every 4 (four) hours as needed (for pain scale < 4).   diphenhydrAMINE 25 mg capsule Commonly known as: BENADRYL Take 25 mg by  mouth every 6 (six) hours as needed for allergies.   folic acid 1 MG tablet Commonly known as: FOLVITE Take 1 mg by mouth daily.   ibuprofen 600 MG tablet Commonly known as: ADVIL Take 1 tablet (600 mg total) by mouth every 6 (six) hours.   polyethylene glycol powder 17 GM/SCOOP powder Commonly known as: GLYCOLAX/MIRALAX Take 17 g by mouth daily as needed.   prenatal multivitamin Tabs tablet Take 1 tablet by mouth daily at 12 noon.        Discharge home in stable condition Infant Feeding: Bottle and  Breast Infant Disposition:home with mother Discharge instruction: per After Visit Summary and Postpartum booklet. Activity: Advance as tolerated. Pelvic rest for 6 weeks.  Diet: routine diet Future Appointments: Future Appointments  Date Time Provider [place]  02/23/2020 10:50 AM Laury Deep, CNM [facility] None   Follow up Visit:   Please schedule this patient for a In person postpartum visit in 4 weeks with the following provider: Any provider. Additional Postpartum F/U: None  Low risk pregnancy complicated by: transfer of care _0  weeks Delivery mode:  Vaginal, Spontaneous  Anticipated Birth Control:  Unsure   01/28/2020 Clarnce Flock, MD

## 2020-01-26 NOTE — Lactation Note (Signed)
This note was copied from a baby's chart. Lactation Consultation Note  Patient Name: Kathryn Mack Date: 01/26/2020 Reason for consult: Initial assessment;Primapara;1st time breastfeeding;Early term 37-38.6wks Baby 9 hours old  Per mom baby last attempted at 4:30 pm and baby was sleepy.  LC recommended STS with baby and checked the diaper and it was dry,  Baby woke up and rooting and LC assisted mom to latch with depth and increased swallows with  Moist heat on the breast. Baby still feeding at 20 mins.  Per mom cramping with feeding .  LC reassured mom that is normal , usually doesn't last longer than 72 hours and recommended  Emptying bladder prior to latch and pumping and should decrease cramping.  Per mom comfortable with latch and no pinching.  LC showed mom reverse pressure due to areola edema.  Per Johnnette Litter set up a DEBP due to when she hand express did not see any colostrum.  Per mom pumped x 1 with the DEBP/ 24 F and it was comfortable.  Mom expressed she had + breast changes both breast.  Per mom has a DEBP- Medela at home.  LC reviewed potential feeding behaviors with a Early term infant and provided the hand out for  LPT and explained to mom and provided the [facility] pamphlet with phone numbers.     Maternal Data Has patient been taught Hand Expression?: Yes Does the patient have breastfeeding experience prior to this delivery?: No  Feeding Feeding Type: Breast Fed  LATCH Score Latch: Grasps breast easily, tongue down, lips flanged, rhythmical sucking.  Audible Swallowing: Spontaneous and intermittent  Type of Nipple: Everted at rest and after stimulation  Comfort (Breast/Nipple): Soft / non-tender  Hold (Positioning): Assistance needed to correctly position infant at breast and maintain latch.  LATCH Score: 9  Interventions Interventions: Assisted with latch;Breast feeding basics reviewed;Skin to skin;Breast massage;Breast compression;Adjust  position;Reverse pressure;Position options;Support pillows  Lactation Tools Discussed/Used Tools: Pump Breast pump type: Double-Electric Breast Pump Pump Review: Milk Storage;Setup, frequency, and cleaning Initiated by:: Dolly Rias  Date initiated:: 01/26/20   Consult Status Consult Status: Follow-up Date: 01/27/20 Follow-up type: In-patient    Matilde Sprang Marrissa Dai 01/26/2020, 7:00 PM

## 2020-01-26 NOTE — Progress Notes (Signed)
Patient ID: Kathryn Mack, female   DOB: Oct 03, 1985, 34 y.o.   MRN: [id number]  Cervical foley only stayed in about 2hrs; now feeling some mild crampin  BP 125/85, P 96 FHR 125-130, +accels, no decels Ctx irreg 1-4 mins, mild Cx 3-4/80/vtx -2  IUP@37 .3wks ROM x 18+hrs Latent phase  Will start Pitocin and titrate to achieve active labor Anticipate vag del  Arabella Merles CNM 01/26/2020 1:38 AM

## 2020-01-27 LAB — CBC
HCT: 34.3 % — ABNORMAL LOW (ref 36.0–46.0)
Hemoglobin: 11.6 g/dL — ABNORMAL LOW (ref 12.0–15.0)
MCH: 30.7 pg (ref 26.0–34.0)
MCHC: 33.8 g/dL (ref 30.0–36.0)
MCV: 90.7 fL (ref 80.0–100.0)
Platelets: 204 10*3/uL (ref 150–400)
RBC: 3.78 MIL/uL — ABNORMAL LOW (ref 3.87–5.11)
RDW: 13.4 % (ref 11.5–15.5)
WBC: 25.6 10*3/uL — ABNORMAL HIGH (ref 4.0–10.5)
nRBC: 0 % (ref 0.0–0.2)

## 2020-01-27 NOTE — Progress Notes (Signed)
POSTPARTUM PROGRESS NOTE  Post Partum Day 1  Subjective:  Kathryn Mack is a 34 y.o. G1P1001 s/p NSVD at [redacted]w[redacted]d.  She reports she is doing well. No acute events overnight. She denies any problems with ambulating, voiding or po intake. Denies nausea or vomiting.  Pain is well controlled.  Lochia is appropriate.  Objective: Blood pressure 113/76, pulse 94, temperature 98.6 F (37 C), temperature source Oral, resp. rate 18, height 5\' 4"  (1.626 m), weight 85.7 kg, last menstrual period 05/09/2019, SpO2 99 %, unknown if currently breastfeeding.  Physical Exam:  General: alert, cooperative and no distress Chest: no respiratory distress Heart:regular rate, distal pulses intact Abdomen: soft, nontender,  Uterine Fundus: firm, appropriately tender DVT Evaluation: No calf swelling or tenderness Extremities: no LE edema Skin: warm, dry  Recent Labs    01/25/20 1117 01/27/20 0625  HGB 13.0 11.6*  HCT 38.4 34.3*    Assessment/Plan: Kathryn Mack is a 34 y.o. G1P1001 s/p NSVD at [redacted]w[redacted]d   PPD#1 - Doing well  Routine postpartum care BP: currently with excellent BP, was elevated during labor but was unmedicated for majority, ctm Contraception: unsure, discussed LARCs Feeding: breast Dispo: Plan for discharge PPD#2.   LOS: 2 days   [redacted]w[redacted]d, MD/[facility] OB Fellow  01/27/2020, 8:46 AM

## 2020-01-27 NOTE — Lactation Note (Signed)
This note was copied from a baby's chart. Lactation Consultation Note  Patient Name: Kathryn Mack FEXMD'Y Date: 01/27/2020 Reason for consult: Follow-up assessment;Primapara  Mom requested that I return. Hand expression was taught to Mom with no results. I noted there was pitting edema on the underside of her L breast. I suggested that Mom lean back & massage towards her axillae. Mom does have the proper size flanges.   Latching was attempted on the L breast with the nipple shield, but without success.   I spoke to Mom about infant's early-term status and inability to express milk at this time. I let her know that unless she could replicate more feedings like the one at 1240, infant would likely need to be supplemented with DBM or formula. In case supplementing at the breast can be done, I prepared a 5 Fr & placed it and its syringe, paper tape, etc & explained to RN.     Lurline Hare [facility] 01/27/2020, 3:34 PM

## 2020-01-27 NOTE — Lactation Note (Signed)
This note was copied from a baby's chart. Lactation Consultation Note  Patient Name: Kathryn Mack RNHAF'B Date: 01/27/2020 Reason for consult: Follow-up assessment;Primapara  F/u visit at 26 hrs of life. Infant just passed his 1st stool. Mom had tried to feed infant earlier, but, in essence, infant has gone 8 hrs between feedings.   Mom has compression stripes on both nipples along with markings on her R breast that may be from using size 24 flanges (Mom needs size 21 flanges, which were provided, along with coconut oil).   Using the teacup hold, latching to the bare breast on the L side was attempted, but infant would only suckle for 1-2 min before falling asleep. A size 20 nipple shield was attempted to see if it would help infant maintain latch for a longer period of time since Mom's nipple was flat/short-shafted. Infant was able to use the size 20 nipple shield without difficulty, but there was no improvement in signs of milk transfer. "Jasir" did act as if he wanted to get a larger mouth full with the nipple shield, so I also brought in a size 24 nipple shield.   Meanwhile Mom was able to get him latched to her R breast (without the nipple shield). Mom was taught how to do breast compressions, which did increase the frequency of swallows). Mom is now able to identify the sound of swallows.   Mom will call me when she is finished feeding so that I can assist her in expressing her milk.   Mom understands that infant's early-term status can contribute to not waking for feedings and/or falling asleep too quickly at the breast. However, with breast compressions, infant has maintained this latch for 13 minutes.  Lurline Hare [facility] 01/27/2020, 12:44 PM

## 2020-01-28 MED ORDER — IBUPROFEN 600 MG PO TABS: 600.0000 mg | ORAL_TABLET | Freq: Four times a day (QID) | ORAL | 0 refills | Status: DC

## 2020-01-28 MED ORDER — ACETAMINOPHEN 325 MG PO TABS: 650.0000 mg | ORAL_TABLET | ORAL | 0 refills | Status: DC | PRN

## 2020-01-28 MED ORDER — POLYETHYLENE GLYCOL 3350 17 GM/SCOOP PO POWD
17.0000 g | Freq: Every day | ORAL | 1 refills | Status: DC | PRN
Start: 2020-01-28 — End: 2020-07-03

## 2020-01-28 NOTE — Discharge Instructions (Signed)
Postpartum Care After Vaginal Delivery This sheet gives you information about how to care for yourself from the time you deliver your baby to up to 6-12 weeks after delivery (postpartum period). Your health care provider may also give you more specific instructions. If you have problems or questions, contact your health care provider. Follow these instructions at home: Vaginal bleeding  It is normal to have vaginal bleeding (lochia) after delivery. Wear a sanitary pad for vaginal bleeding and discharge. ? During the first week after delivery, the amount and appearance of lochia is often similar to a menstrual period. ? Over the next few weeks, it will gradually decrease to a dry, yellow-brown discharge. ? For most women, lochia stops completely by 4-6 weeks after delivery. Vaginal bleeding can vary from woman to woman.  Change your sanitary pads frequently. Watch for any changes in your flow, such as: ? A sudden increase in volume. ? A change in color. ? Large blood clots.  If you pass a blood clot from your vagina, save it and call your health care provider to discuss. Do not flush blood clots down the toilet before talking with your health care provider.  Do not use tampons or douches until your health care provider says this is safe.  If you are not breastfeeding, your period should return 6-8 weeks after delivery. If you are feeding your child breast milk only (exclusive breastfeeding), your period may not return until you stop breastfeeding. Perineal care  Keep the area between the vagina and the anus (perineum) clean and dry as told by your health care provider. Use medicated pads and pain-relieving sprays and creams as directed.  If you had a cut in the perineum (episiotomy) or a tear in the vagina, check the area for signs of infection until you are healed. Check for: ? More redness, swelling, or pain. ? Fluid or blood coming from the cut or tear. ? Warmth. ? Pus or a bad  smell.  You may be given a squirt bottle to use instead of wiping to clean the perineum area after you go to the bathroom. As you start healing, you may use the squirt bottle before wiping yourself. Make sure to wipe gently.  To relieve pain caused by an episiotomy, a tear in the vagina, or swollen veins in the anus (hemorrhoids), try taking a warm sitz bath 2-3 times a day. A sitz bath is a warm water bath that is taken while you are sitting down. The water should only come up to your hips and should cover your buttocks. Breast care  Within the first few days after delivery, your breasts may feel heavy, full, and uncomfortable (breast engorgement). Milk may also leak from your breasts. Your health care provider can suggest ways to help relieve the discomfort. Breast engorgement should go away within a few days.  If you are breastfeeding: ? Wear a bra that supports your breasts and fits you well. ? Keep your nipples clean and dry. Apply creams and ointments as told by your health care provider. ? You may need to use breast pads to absorb milk that leaks from your breasts. ? You may have uterine contractions every time you breastfeed for up to several weeks after delivery. Uterine contractions help your uterus return to its normal size. ? If you have any problems with breastfeeding, work with your health care provider or lactation consultant.  If you are not breastfeeding: ? Avoid touching your breasts a lot. Doing this can make   your breasts produce more milk. ? Wear a good-fitting bra and use cold packs to help with swelling. ? Do not squeeze out (express) milk. This causes you to make more milk. Intimacy and sexuality  Ask your health care provider when you can engage in sexual activity. This may depend on: ? Your risk of infection. ? How fast you are healing. ? Your comfort and desire to engage in sexual activity.  You are able to get pregnant after delivery, even if you have not had  your period. If desired, talk with your health care provider about methods of birth control (contraception). Medicines  Take over-the-counter and prescription medicines only as told by your health care provider.  If you were prescribed an antibiotic medicine, take it as told by your health care provider. Do not stop taking the antibiotic even if you start to feel better. Activity  Gradually return to your normal activities as told by your health care provider. Ask your health care provider what activities are safe for you.  Rest as much as possible. Try to rest or take a nap while your baby is sleeping. Eating and drinking   Drink enough fluid to keep your urine pale yellow.  Eat high-fiber foods every day. These may help prevent or relieve constipation. High-fiber foods include: ? Whole grain cereals and breads. ? Brown rice. ? Beans. ? Fresh fruits and vegetables.  Do not try to lose weight quickly by cutting back on calories.  Take your prenatal vitamins until your postpartum checkup or until your health care provider tells you it is okay to stop. Lifestyle  Do not use any products that contain nicotine or tobacco, such as cigarettes and e-cigarettes. If you need help quitting, ask your health care provider.  Do not drink alcohol, especially if you are breastfeeding. General instructions  Keep all follow-up visits for you and your baby as told by your health care provider. Most women visit their health care provider for a postpartum checkup within the first 3-6 weeks after delivery. Contact a health care provider if:  You feel unable to cope with the changes that your child brings to your life, and these feelings do not go away.  You feel unusually sad or worried.  Your breasts become red, painful, or hard.  You have a fever.  You have trouble holding urine or keeping urine from leaking.  You have little or no interest in activities you used to enjoy.  You have not  breastfed at all and you have not had a menstrual period for 12 weeks after delivery.  You have stopped breastfeeding and you have not had a menstrual period for 12 weeks after you stopped breastfeeding.  You have questions about caring for yourself or your baby.  You pass a blood clot from your vagina. Get help right away if:  You have chest pain.  You have difficulty breathing.  You have sudden, severe leg pain.  You have severe pain or cramping in your lower abdomen.  You bleed from your vagina so much that you fill more than one sanitary pad in one hour. Bleeding should not be heavier than your heaviest period.  You develop a severe headache.  You faint.  You have blurred vision or spots in your vision.  You have bad-smelling vaginal discharge.  You have thoughts about hurting yourself or your baby. If you ever feel like you may hurt yourself or others, or have thoughts about taking your own life, get help  right away. You can go to the nearest emergency department or call:  Your local emergency services (911 in the [place]  A suicide crisis helpline, such as the National Suicide Prevention Lifeline at 1-800-273-8255. This is open 24 hours a day. Summary  The period of time right after you deliver your newborn up to 6-12 weeks after delivery is called the postpartum period.  Gradually return to your normal activities as told by your health care provider.  Keep all follow-up visits for you and your baby as told by your health care provider. This information is not intended to replace advice given to you by your health care provider. Make sure you discuss any questions you have with your health care provider. Document Revised: 07/17/2017 Document Reviewed: 04/27/2017 Elsevier Patient Education  2020 Elsevier Inc.    Contraception Choices Contraception, also called birth control, means things to use or ways to try not to get pregnant. Hormonal birth control This kind  of birth control uses hormones. Here are some types of hormonal birth control:  A tube that is put under skin of the arm (implant). The tube can stay in for as long as 3 years.  Shots to get every 3 months (injections).  Pills to take every day (birth control pills).  A patch to change 1 time each week for 3 weeks (birth control patch). After that, the patch is taken off for 1 week.  A ring to put in the vagina. The ring is left in for 3 weeks. Then it is taken out of the vagina for 1 week. Then a new ring is put in.  Pills to take after unprotected sex (emergency birth control pills). Barrier birth control Here are some types of barrier birth control:  A thin covering that is put on the penis before sex (female condom). The covering is thrown away after sex.  A soft, loose covering that is put in the vagina before sex (female condom). The covering is thrown away after sex.  A rubber bowl that sits over the cervix (diaphragm). The bowl must be made for you. The bowl is put into the vagina before sex. The bowl is left in for 6-8 hours after sex. It is taken out within 24 hours.  A small, soft cup that fits over the cervix (cervical cap). The cup must be made for you. The cup can be left in for 6-8 hours after sex. It is taken out within 48 hours.  A sponge that is put into the vagina before sex. It must be left in for at least 6 hours after sex. It must be taken out within 30 hours. Then it is thrown away.  A chemical that kills or stops sperm from getting into the uterus (spermicide). It may be a pill, cream, jelly, or foam to put in the vagina. The chemical should be used at least 10-15 minutes before sex. IUD (intrauterine) birth control An IUD is a small, T-shaped piece of plastic. It is put inside the uterus. There are two kinds:  Hormone IUD. This kind can stay in for 3-5 years.  Copper IUD. This kind can stay in for 10 years. Permanent birth control Here are some types of  permanent birth control:  Surgery to block the fallopian tubes.  Having an insert put into each fallopian tube.  Surgery to tie off the tubes that carry sperm (vasectomy). Natural planning birth control Here are some types of natural planning birth control:  Not having sex on   the days the woman could get pregnant.  Using a calendar: ? To keep track of the length of each period. ? To find out what days pregnancy can happen. ? To plan to not have sex on days when pregnancy can happen.  Watching for symptoms of ovulation and not having sex during ovulation. One way the woman can check for ovulation is to check her temperature.  Waiting to have sex until after ovulation. Summary  Contraception, also called birth control, means things to use or ways to try not to get pregnant.  Hormonal methods of birth control include implants, injections, pills, patches, vaginal rings, and emergency birth control pills.  Barrier methods of birth control can include female condoms, female condoms, diaphragms, cervical caps, sponges, and spermicides.  There are two types of IUD (intrauterine device) birth control. An IUD can be put in a woman's uterus to prevent pregnancy for 3-5 years.  Permanent sterilization can be done through a procedure for males, females, or both.  Natural planning methods involve not having sex on the days when the woman could get pregnant. This information is not intended to replace advice given to you by your health care provider. Make sure you discuss any questions you have with your health care provider. Document Revised: 11/03/2018 Document Reviewed: 07/24/2016 Elsevier Patient Education  2020 [employer].

## 2020-01-28 NOTE — Lactation Note (Signed)
This note was copied from a baby's chart. Lactation Consultation Note  Patient Name: Boy Keyaira Clapham [id number]'R Date: 01/28/2020    Mom no longer needs a nipple shield to latch infant to the L breast. Mom is doing BR/FO.Extra-slow flow nipples provided b/c Mom's description of slow-flow nipples sounded as if the flow was too fast.   Mom has a Medela DEBP at home. Mom is happy w/the size 21 flanges. Mom has been pumping q3-4 h since yesterday evening.   Mom knows she can call me to return, if desired. Lurline Hare [facility] 01/28/2020, 8:31 AM

## 2020-01-29 ENCOUNTER — Other Ambulatory Visit: Payer: Self-pay | Admitting: Advanced Practice Midwife

## 2020-01-31 LAB — SURGICAL PATHOLOGY

## 2020-02-14 NOTE — Telephone Encounter (Signed)
Left voice message for patient to return nurse call regarding FMLA.  Clovis Pu, RN

## 2020-02-23 ENCOUNTER — Ambulatory Visit: Payer: BLUE CROSS/BLUE SHIELD | Admitting: Obstetrics and Gynecology

## 2020-03-06 ENCOUNTER — Encounter: Payer: Self-pay | Admitting: Family

## 2020-03-06 ENCOUNTER — Other Ambulatory Visit: Payer: Self-pay

## 2020-03-06 ENCOUNTER — Ambulatory Visit
Admission: RE | Admit: 2020-03-06 | Discharge: 2020-03-06 | Disposition: A | Discharge status: Home / Self Care | Payer: BLUE CROSS/BLUE SHIELD | Source: Ambulatory Visit | Attending: Family | Admitting: Family

## 2020-03-06 ENCOUNTER — Ambulatory Visit (INDEPENDENT_AMBULATORY_CARE_PROVIDER_SITE_OTHER): Payer: BLUE CROSS/BLUE SHIELD | Admitting: Family

## 2020-03-06 VITALS — BP 100/70 | HR 85 | Temp 97.3°F | Resp 16 | Ht 64.0 in | Wt 175.6 lb

## 2020-03-06 DIAGNOSIS — M545 Low back pain, unspecified: Secondary | ICD-10-CM

## 2020-03-06 DIAGNOSIS — Z0000 Encounter for general adult medical examination without abnormal findings: Secondary | ICD-10-CM | POA: Diagnosis not present

## 2020-03-06 DIAGNOSIS — M25552 Pain in left hip: Secondary | ICD-10-CM | POA: Diagnosis not present

## 2020-03-06 DIAGNOSIS — Z6830 Body mass index (BMI) 30.0-30.9, adult: Secondary | ICD-10-CM | POA: Diagnosis not present

## 2020-03-06 NOTE — Progress Notes (Signed)
Provider: Richarda Blade FNP-C   Tesia Lybrand, Donalee Citrin, NP  Patient Care Team: Makena Murdock, Donalee Citrin, NP as PCP - General (Family Medicine)  Extended Emergency Contact Information Primary Emergency Contact: Mitchell,Darnell Address: [address]          [place], [place] [address] [place] Phone: [phone] Relation: Spouse  Code Status: Full Code  Goals of care: Advanced Directive information Advanced Directives 03/06/2020  Does Patient Have a Medical Advance Directive? No  Would patient like information on creating a medical advance directive? No - Patient declined     Chief Complaint  Patient presents with  . Establish Care    New Patient.     HPI:  Pt is a 34 y.o. female seen today to establish care for medical management of chronic diseases.she was previously follow up with PCP in [place].States was in a toxic relationship but recently got married to someone else.feels good has been exercising and taking good care of her self.sleeps better since she started her exercise. she recently had a baby 6 weeks ago.She had epidural for delivery.States had a left hip pain despite the epidural.left hip pain has persist worst with standing,walking or sitting a certain way.Pain sometimes shots down to her foot.Leg feels like going to give out at times.shedenies any numbness,tingling or weakness on the leg.No bladder or bowel incontinence reported.  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  Takes Tylenol once in a while if she has a headache. Has upcoming appointment for post delivery visit with OB/GYn.No records for pap smear but states was done in [place].will sign release of records.   Past Medical History:  Diagnosis Date  . Gonorrhea 2010  . Medical history  non-contributory    Past Surgical History:  Procedure Laterality Date  . LEEP  2013  . NO PAST SURGERIES      No Known Allergies  Allergies as of 03/06/2020   No Known Allergies     Medication List       Accurate as of March 06, 2020 11:32 AM. If you have any questions, ask your nurse or doctor.        acetaminophen 325 MG tablet Commonly known as: Tylenol Take 2 tablets (650 mg total) by mouth every 4 (four) hours as needed (for pain scale < 4).   diphenhydrAMINE 25 mg capsule Commonly known as: BENADRYL Take 25 mg by mouth every 6 (six) hours as needed for allergies.   folic acid 400 MCG tablet Commonly known as: FOLVITE Take 1 mg by mouth daily.   ibuprofen 600 MG tablet Commonly known as: ADVIL Take 1 tablet (600 mg total) by mouth every 6 (six) hours.   polyethylene glycol powder 17 GM/SCOOP powder Commonly known as: GLYCOLAX/MIRALAX Take 17 g by mouth daily as needed.   prenatal multivitamin Tabs tablet Take 1 tablet by mouth daily at 12 noon.       Review of Systems  Constitutional: Negative for appetite change, chills, fatigue and fever.  HENT: Negative for congestion, rhinorrhea, sinus pressure, sinus pain, sneezing and sore throat.   Eyes: Negative for discharge, redness and itching.  Respiratory: Negative for cough, chest tightness, shortness of breath and wheezing.   Cardiovascular: Negative for chest pain, palpitations and leg swelling.  Gastrointestinal:  Negative for abdominal distention, abdominal pain, constipation, diarrhea, nausea and vomiting.  Endocrine: Negative for cold intolerance, heat intolerance, polydipsia, polyphagia and polyuria.  Genitourinary: Negative for difficulty urinating, dysuria, flank pain, frequency and urgency.  Musculoskeletal: Positive for arthralgias and back pain. Negative for gait problem, joint swelling, myalgias and neck pain.  Skin: Negative for color change and rash.  Neurological: Negative for dizziness,  speech difficulty, weakness, light-headedness, numbness and headaches.  Hematological: Does not bruise/bleed easily.  Psychiatric/Behavioral: Negative for agitation, confusion, hallucinations and sleep disturbance. The patient is not nervous/anxious.     Immunization History  Administered Date(s) Administered  . Tdap 01/18/2020   Pertinent  Health Maintenance Due  Topic Date Due  . PAP SMEAR-Modifier  Never done  . INFLUENZA VACCINE  02/26/2020   Fall Risk  03/06/2020  Falls in the past year? 0  Number falls in past yr: 0  Injury with Fall? 0    Vitals:   03/06/20 1116  BP: 100/70  Pulse: 85  Resp: 16  Temp: (!) 97.3 F (36.3 C)  SpO2: 98%  Weight: 175 lb 9.6 oz (79.7 kg)  Height: 5\' 4"  (1.626 m)   Body mass index is 30.14 kg/m. Physical Exam Vitals reviewed.  Constitutional:      General: She is not in acute distress.    Appearance: She is not ill-appearing.  HENT:     Head: Normocephalic.     Right Ear: Tympanic membrane, ear canal and external ear normal. There is no impacted cerumen.     Left Ear: Tympanic membrane, ear canal and external ear normal. There is no impacted cerumen.     Nose: Nose normal. No congestion or rhinorrhea.     Mouth/Throat:     Mouth: Mucous membranes are moist.     Pharynx: Oropharynx is clear. No oropharyngeal exudate or posterior oropharyngeal erythema.  Eyes:     General: No scleral icterus.       Right eye: No discharge.        Left eye: No discharge.     Extraocular Movements: Extraocular movements intact.     Conjunctiva/sclera: Conjunctivae normal.     Pupils: Pupils are equal, round, and reactive to light.  Neck:     Vascular: No carotid bruit.  Cardiovascular:     Rate and Rhythm: Normal rate and regular rhythm.     Pulses: Normal pulses.     Heart sounds: Normal heart sounds. No murmur heard.  No friction rub. No gallop.   Pulmonary:     Effort: Pulmonary effort is normal. No respiratory distress.     Breath sounds:  Normal breath sounds. No wheezing, rhonchi or rales.  Chest:     Chest wall: No tenderness.  Abdominal:     General: Bowel sounds are normal. There is no distension.     Palpations: Abdomen is soft. There is no mass.     Tenderness: There is no abdominal tenderness. There is no right CVA tenderness, left CVA tenderness, guarding or rebound.  Musculoskeletal:        General: No swelling. Normal range of motion.     Cervical back: Normal range of motion. No rigidity or tenderness.     Lumbar back: Tenderness present. No swelling, edema or spasms. Normal range of motion. Negative right straight leg raise test and negative left straight leg raise test.     Right hip: Normal.     Left hip: Tenderness present. No crepitus. Normal range of motion. Normal strength.  Right lower leg: No edema.     Left lower leg: No edema.  Lymphadenopathy:     Cervical: No cervical adenopathy.  Skin:    General: Skin is warm and dry.     Coloration: Skin is not pale.     Findings: No bruising, erythema, lesion or rash.  Neurological:     Mental Status: She is alert and oriented to person, place, and time.     Cranial Nerves: No cranial nerve deficit.     Sensory: No sensory deficit.     Motor: No weakness.     Coordination: Coordination normal.     Gait: Gait normal.  Psychiatric:        Mood and Affect: Mood normal.        Behavior: Behavior normal.        Thought Content: Thought content normal.        Judgment: Judgment normal.    Labs reviewed:  Recent Labs    12/23/19 1024 01/25/20 1117 01/27/20 0625  WBC  --  9.0 25.6*  HGB 12.6 13.0 11.6*  HCT 36 38.4 34.3*  MCV  --  91.0 90.7  PLT 217 223 204    Significant Diagnostic Results in last 30 days:  No results found.  Assessment/Plan 1. Left hip pain Worst since she recently  Delivered her baby.Tender to palpation.No leg weakness,bladder or bowel changes.  - she will request her HR at work to fax FMLA paperwork to be filled.  - DG  Hip Unilat W OR W/O Pelvis 2-3 Views Left; Future - advised to get left hip X-ray done at [address] at [place] [facility]  2. Bilateral low back pain without sciatica, unspecified chronicity Tender across the lumbar spine. - DG Hip Unilat W OR W/O Pelvis 2-3 Views Left; Future  3. Encounter for medical examination to establish care Previously following up with PCP in [place].overall health has been good.No routine medication. - continue on dietary modification and exercise.will need annual Physical with fasting lab work.  Family/ staff Communication: Reviewed plan of care with patient  Labs/tests ordered: None   Next Appointment : 4 months for Annual Physical Exam with fasting labs.  Caesar Bookman, NP

## 2020-03-07 ENCOUNTER — Encounter: Payer: Self-pay | Admitting: Obstetrics and Gynecology

## 2020-03-07 ENCOUNTER — Ambulatory Visit (INDEPENDENT_AMBULATORY_CARE_PROVIDER_SITE_OTHER): Payer: BLUE CROSS/BLUE SHIELD | Admitting: Obstetrics and Gynecology

## 2020-03-07 NOTE — Progress Notes (Signed)
Post Partum Visit Note  Kathryn Mack is a 34 y.o. G64P1001 female who presents for a postpartum visit. She is 6 weeks postpartum following a normal spontaneous vaginal delivery.  I have fully reviewed the prenatal and intrapartum course. The delivery was at 37.[redacted] weeks gestational.  Anesthesia: epidural. Postpartum course has been uncomplicated. Baby "Delphina Cahill" is doing well and gaining lots of weight. Baby is feeding by breast. Bleeding no bleeding. Bowel function is normal. Bladder function is normal. Patient is not sexually active. No complaints of vaginal pain from laceration repair. Contraception method is rhythm method. Postpartum depression screening: negative.   The pregnancy intention screening data noted above was reviewed. Potential methods of contraception were discussed. The patient elected to proceed with No Method - Other Reason.    Edinburgh Postnatal Depression Scale - 03/07/20 1022      Edinburgh Postnatal Depression Scale:  In the Past 7 Days   I have been able to laugh and see the funny side of things. 0    I have looked forward with enjoyment to things. 0    I have blamed myself unnecessarily when things went wrong. 0    I have been anxious or worried for no good reason. 0    I have felt scared or panicky for no good reason. 0    Things have been getting on top of me. 0    I have been so unhappy that I have had difficulty sleeping. 0    I have felt sad or miserable. 0    I have been so unhappy that I have been crying. 0    The thought of harming myself has occurred to me. 0    Edinburgh Postnatal Depression Scale Total 0            The following portions of the patient's history were reviewed and updated as appropriate: allergies, current medications, past family history, past medical history, past social history, past surgical history and problem list.  Review of Systems Constitutional: negative Eyes: negative Ears, nose, mouth, throat, and face:  negative Respiratory: negative Cardiovascular: negative Gastrointestinal: negative Genitourinary:negative Integument/breast: positive for some decreased milk production in LT breast Hematologic/lymphatic: negative Musculoskeletal:negative Neurological: negative Behavioral/Psych: negative Endocrine: negative Allergic/Immunologic: negative    Objective:  Blood pressure 104/76, pulse 72, temperature 98.5 F (36.9 C), temperature source Oral, weight 178 lb 9.6 oz (81 kg), last menstrual period 05/09/2019, currently breastfeeding.  General:  alert, cooperative and no distress   Breasts:  inspection negative, no nipple discharge or bleeding, no masses or nodularity palpable  Lungs: clear to auscultation bilaterally  Heart:  regular rate and rhythm, S1, S2 normal, no murmur, click, rub or gallop  Abdomen: soft, non-tender; bowel sounds normal; no masses,  no organomegaly   Vulva:  not evaluated  Vagina: not evaluated  Cervix:  not examined  Corpus: not examined  Adnexa:  not evaluated  Rectal Exam: Not performed.        Assessment:  Encounter for postpartum care of lactating mother - Normal postpartum exam. Pap smear not done at today's visit.   Plan:   Essential components of care per ACOG recommendations:  1.  Mood and well being: Patient with negative depression screening today. Reviewed local resources for support.  - Patient does not use tobacco.  - hx of drug use? No    2. Infant care and feeding:  -Patient currently breastmilk feeding? Yes If breastmilk feeding discussed return to work and pumping. If needed,  patient was provided letter for work to allow for every 2-3 hr pumping breaks, and to be granted a private location to express breastmilk and refrigerated area to store breastmilk. Reviewed importance of draining breast regularly to support lactation. -Social determinants of health (SDOH) reviewed in EPIC. No concerns  3. Sexuality, contraception and birth spacing -  Patient does not know want a pregnancy in the next year.  Desired family size is unsure children.  - Reviewed forms of contraception in tiered fashion. Patient desired natural family planning (NFP) today.   - Discussed birth spacing of 18 months  4. Sleep and fatigue -Encouraged family/partner/community support of 4 hrs of uninterrupted sleep to help with mood and fatigue  5. Physical Recovery  - Discussed patients delivery and complications - Patient had a 1st degree laceration, perineal healing reviewed. Patient expressed understanding - Patient has urinary incontinence? No  - Patient is safe to resume physical and sexual activity  6.  Health Maintenance - Last pap smear done 06/16/2019 and was normal with negative HPV. Mammogram @ age 63  7. No Chronic Disease - PCP follow up  [name], CNM [facility] [employer], [facility]

## 2020-03-28 ENCOUNTER — Ambulatory Visit: Payer: BLUE CROSS/BLUE SHIELD | Admitting: Family Medicine

## 2020-03-30 ENCOUNTER — Telehealth: Payer: Self-pay | Admitting: Family

## 2020-03-30 NOTE — Telephone Encounter (Signed)
FMLA paper work completed.patient reported on previous visit left hip and back pain after receiving epidural for delivery in June ,2021.Estimated episode 2-3 times per month for approximately 2-4 days per episode due to worst pain with prolong standing,walking or sitting in a certain way.Paper work placed in box slot to be faxed to patient's employer.

## 2020-04-11 NOTE — Telephone Encounter (Signed)
Patient called and stated that the dates are listed wrong on the FMLA Paperwork. Patient is going to have the Employer to refax the paperwork with directions to correct.    Received paperwork from [facility] (Child psychotherapist II) stating that we needed to remove the dates for "intermittent" and add dates for the question about "incapacity".  Stated that the dates are completely dependent on what your doctor puts for the dates.   Verline Lema, Human Resources)       [phone] x [phone] Fax: [phone]  Placed paperwork in Dinah's folder to correct. To be faxed back once completed.

## 2020-04-13 NOTE — Telephone Encounter (Signed)
Will need referral to Neuro for further evaluation since X-ray didn't indicate any acute abnormality.Unable to use the term  incapacity on FMLA for now.Option could be to have paper work completed by GYN since hip pain onset was after epidural injection during delivery.

## 2020-04-16 NOTE — Telephone Encounter (Signed)
Tried calling patient. No answer.  Faxed paperwork with Dinah's message back to Employer.

## 2020-04-17 NOTE — Telephone Encounter (Signed)
Message left on clinical intake voicemail:   Patient returning call.   I tried to call patient back, left message on voicemail for patient to return call when available

## 2020-04-18 NOTE — Telephone Encounter (Signed)
Patient just called and left voicemail on extension "[phone]". Patient called back and notified that FMLA paper work was filled out and faxed. I asked patient if she needed [place] to do anything else. Patient states she will reach out to HR to make sure that they're not requiring anything else. If so patient states that she will call the office back. Patient made aware that provider Ngetich,Dinah, NP is currently out of office. But when she returns paper work will be filled out if she needs anything else done.

## 2020-04-23 ENCOUNTER — Ambulatory Visit (INDEPENDENT_AMBULATORY_CARE_PROVIDER_SITE_OTHER): Payer: BLUE CROSS/BLUE SHIELD

## 2020-04-23 DIAGNOSIS — Z23 Encounter for immunization: Secondary | ICD-10-CM | POA: Diagnosis not present

## 2020-04-26 NOTE — Telephone Encounter (Signed)
LMOM for patient to return call.   Patient called with employer, Marcelino Duster with HR, on the phone and left voicemail stating that the FMLA paperwork needed to be filled out correctly. Stated that if the provider is in agreeance to patient being out longterm then on the paperwork Page 3 Question 8 needed to be completed and Question 9 needed to be deleted.   Tried calling patient back with Dinah's response on 04/13/20 Ngetich, Donalee Citrin, NP     4:50 PM Note Will need referral to Neuro for further evaluation since X-ray didn't indicate any acute abnormality.Unable to use the term  incapacity on FMLA for now.Option could be to have paper work completed by GYN since hip pain onset was after epidural injection during delivery

## 2020-05-14 ENCOUNTER — Encounter: Payer: Self-pay | Admitting: General Practice

## 2020-05-19 ENCOUNTER — Ambulatory Visit (INDEPENDENT_AMBULATORY_CARE_PROVIDER_SITE_OTHER): Payer: BLUE CROSS/BLUE SHIELD

## 2020-05-19 DIAGNOSIS — Z23 Encounter for immunization: Secondary | ICD-10-CM

## 2020-05-19 NOTE — Progress Notes (Signed)
   Covid-19 Vaccination Clinic  Name:  Kathryn Mack    MRN: [id number] DOB: 01-May-1986  05/19/2020  [name] was observed post Covid-19 immunization for 15 minutes without incident. She was provided with Vaccine Information Sheet and instruction to access the V-Safe system.   [name] was instructed to call 911 with any severe reactions post vaccine: Marland Kitchen Difficulty breathing  . Swelling of face and throat  . A fast heartbeat  . A bad rash all over body  . Dizziness and weakness   Immunizations Administered    Name Date Dose VIS Date Route   Pfizer COVID-19 Vaccine 05/19/2020 11:42 AM 0.3 mL 09/21/2018 Intramuscular   Manufacturer: [employer], [employer]   Lot: [id number]   NDC: [address]-9

## 2020-06-05 ENCOUNTER — Other Ambulatory Visit: Payer: Self-pay

## 2020-06-05 ENCOUNTER — Ambulatory Visit
Admission: RE | Admit: 2020-06-05 | Discharge: 2020-06-05 | Disposition: A | Discharge status: Home / Self Care | Payer: BLUE CROSS/BLUE SHIELD | Source: Ambulatory Visit | Attending: Emergency Medicine | Admitting: Emergency Medicine

## 2020-06-05 VITALS — BP 110/76 | HR 70 | Temp 97.7°F | Resp 16

## 2020-06-05 DIAGNOSIS — B3731 Acute candidiasis of vulva and vagina: Secondary | ICD-10-CM

## 2020-06-05 DIAGNOSIS — B373 Candidiasis of vulva and vagina: Secondary | ICD-10-CM

## 2020-06-05 MED ORDER — FLUCONAZOLE 150 MG PO TABS: 150.0000 mg | ORAL_TABLET | Freq: Every day | ORAL | 0 refills | Status: DC

## 2020-06-05 MED ORDER — MICONAZOLE NITRATE 2 % EX POWD: CUTANEOUS | 0 refills | Status: DC | PRN

## 2020-06-05 NOTE — ED Provider Notes (Signed)
[facility]    CSN: [id number] Arrival date & time: 06/05/20  0955      History   Chief Complaint Chief Complaint  Patient presents with  . Vaginal Discharge    HPI Kathryn Mack is a 34 y.o. female  Kathryn Mack is a 34 y.o. female who presents for evaluation of vaginal discharge white. Symptoms have been present for 2 days. Symptoms include vulvar itching. Menstrual pattern: bleeding regularly. Contraception: none STI Risk: Very low risk of STD exposure  The following portions of the patient's history were reviewed and updated as appropriate: allergies, current medications, past family history, past medical history, past social history, past surgical history and problem list.     Past Medical History:  Diagnosis Date  . Gonorrhea 2010  . Medical history non-contributory     Patient Active Problem List   Diagnosis Date Noted  . Full-term premature rupture of membranes 01/25/2020  . Supervision of normal first pregnancy, antepartum 01/09/2020    Past Surgical History:  Procedure Laterality Date  . LEEP  2013  . NO PAST SURGERIES      OB History    Gravida  1   Para  1   Term  1   Preterm      AB      Living  1     SAB      TAB      Ectopic      Multiple  0   Live Births  1            Home Medications    Prior to Admission medications   Medication Sig Start Date End Date Taking? Authorizing Provider  acetaminophen (TYLENOL) 325 MG tablet Take 2 tablets (650 mg total) by mouth every 4 (four) hours as needed (for pain scale < 4). 01/28/20   Venora Maples, MD  diphenhydrAMINE (BENADRYL) 25 mg capsule Take 25 mg by mouth every 6 (six) hours as needed for allergies.    [provider]  fluconazole (DIFLUCAN) 150 MG tablet Take 1 tablet (150 mg total) by mouth daily. May repeat in 72 hours if needed 06/05/20   Hall-Potvin, [place], PA-C  folic acid (FOLVITE) 400 MCG tablet Take 1 mg by mouth daily.     [provider]  ibuprofen (ADVIL) 600 MG tablet Take 1 tablet (600 mg total) by mouth every 6 (six) hours. 01/28/20   Venora Maples, MD  miconazole (MICOTIN) 2 % powder Apply topically as needed for itching. 06/05/20   Hall-Potvin, [place], PA-C  polyethylene glycol powder (GLYCOLAX/MIRALAX) 17 GM/SCOOP powder Take 17 g by mouth daily as needed. 01/28/20   Venora Maples, MD  Prenatal Vit-Fe Fumarate-FA (PRENATAL MULTIVITAMIN) TABS tablet Take 1 tablet by mouth daily at 12 noon.    [provider]    Family History Family History  Problem Relation Age of Onset  . High blood pressure Mother   . Arthritis Father   . Hypertension Father     Social History Social History   Tobacco Use  . Smoking status: Never Smoker  . Smokeless tobacco: Never Used  Vaping Use  . Vaping Use: Never used  Substance Use Topics  . Alcohol use: Not Currently    Comment: 1-2 per week  . Drug use: Never     Allergies   Patient has no known allergies.   Review of Systems Review of Systems  Constitutional: Negative for fatigue and fever.  Respiratory: Negative for  cough and shortness of breath.   Cardiovascular: Negative for chest pain and palpitations.  Gastrointestinal: Negative for constipation and diarrhea.  Genitourinary: Positive for vaginal discharge. Negative for dysuria, flank pain, frequency, hematuria, pelvic pain, urgency, vaginal bleeding and vaginal pain.      Physical Exam Triage Vital Signs ED Triage Vitals  Enc Vitals Group     BP      Pulse      Resp      Temp      Temp src      SpO2      Weight      Height      Head Circumference      Peak Flow      Pain Score      Pain Loc      Pain Edu?      Excl. in GC?    No data found.  Updated Vital Signs BP 110/76 (BP Location: Right Arm)   Pulse 70   Temp 97.7 F (36.5 C) (Oral)   Resp 16   LMP 05/28/2020   SpO2 96%   Visual Acuity Right Eye Distance:   Left Eye Distance:   Bilateral Distance:     Right Eye Near:   Left Eye Near:    Bilateral Near:     Physical Exam Constitutional:      General: She is not in acute distress. HENT:     Head: Normocephalic and atraumatic.  Eyes:     General: No scleral icterus.    Pupils: Pupils are equal, round, and reactive to light.  Cardiovascular:     Rate and Rhythm: Normal rate.  Pulmonary:     Effort: Pulmonary effort is normal.  Abdominal:     General: Bowel sounds are normal.     Palpations: Abdomen is soft.     Tenderness: There is no abdominal tenderness. There is no right CVA tenderness, left CVA tenderness or guarding.  Genitourinary:    Comments: Patient declined, self-swab performed Skin:    Coloration: Skin is not jaundiced or pale.  Neurological:     Mental Status: She is alert and oriented to person, place, and time.      UC Treatments / Results  Labs (all labs ordered are listed, but only abnormal results are displayed) Labs Reviewed - No data to display  EKG   Radiology No results found.  Procedures Procedures (including critical care time)  Medications Ordered in UC Medications - No data to display  Initial Impression / Assessment and Plan / UC Course  I have reviewed the triage vital signs and the nursing notes.  Pertinent labs & imaging results that were available during my care of the patient were reviewed by me and considered in my medical decision making (see chart for details).     Pt has h/o yeast vaginitis, feels similar.  Will treat w/ diflucan.  Pt not pregnant, is breastfeeding.  Declines STI screening at this time.  Return precautions discussed, pt verbalized understanding and is agreeable to plan. Final Clinical Impressions(s) / UC Diagnoses   Final diagnoses:  Yeast vaginitis   Discharge Instructions   None    ED Prescriptions    Medication Sig Dispense Auth. Provider   fluconazole (DIFLUCAN) 150 MG tablet Take 1 tablet (150 mg total) by mouth daily. May repeat in 72 hours  if needed 2 tablet Hall-Potvin, [place], PA-C   miconazole (MICOTIN) 2 % powder Apply topically as needed for itching. 70 g Hall-Potvin,  [place], PA-C     PDMP not reviewed this encounter.   Hall-Potvin, [place], [place] 06/05/20 1112

## 2020-06-05 NOTE — ED Triage Notes (Signed)
Pt present vaginal discharge with some itching feeling. Symptoms started two days ago. Pt state symptoms is not constant but just some irration.

## 2020-07-03 ENCOUNTER — Other Ambulatory Visit: Payer: Self-pay

## 2020-07-03 ENCOUNTER — Encounter: Payer: BLUE CROSS/BLUE SHIELD | Admitting: Family

## 2020-07-03 ENCOUNTER — Ambulatory Visit (INDEPENDENT_AMBULATORY_CARE_PROVIDER_SITE_OTHER): Payer: BLUE CROSS/BLUE SHIELD | Admitting: Family

## 2020-07-03 ENCOUNTER — Encounter: Payer: Self-pay | Admitting: Family

## 2020-07-03 VITALS — BP 110/70 | HR 76 | Temp 98.2°F | Resp 16 | Ht 64.0 in | Wt 184.2 lb

## 2020-07-03 DIAGNOSIS — T7840XS Allergy, unspecified, sequela: Secondary | ICD-10-CM | POA: Diagnosis not present

## 2020-07-03 DIAGNOSIS — Z0000 Encounter for general adult medical examination without abnormal findings: Secondary | ICD-10-CM

## 2020-07-03 DIAGNOSIS — Z23 Encounter for immunization: Secondary | ICD-10-CM

## 2020-07-03 NOTE — Progress Notes (Addendum)
Provider: Richarda Blade FNP-C   Rafeal Skibicki, Donalee Citrin, NP  Patient Care Team: Coralyn Roselli, Donalee Citrin, NP as PCP - General (Family Medicine)  Extended Emergency Contact Information Primary Emergency Contact: Mitchell,Darnell Address: [address]          [place], [place] [address] [place] Phone: [phone] Relation: Spouse  Code Status: Full Code  Goals of care: Advanced Directive information Advanced Directives 07/03/2020  Does Patient Have a Medical Advance Directive? No  Would patient like information on creating a medical advance directive? No - Patient declined     Chief Complaint  Patient presents with  . Annual Exam    Physical  . Labs    Fasting Labs  . Immunizations    Discuss the need for Influenza Vaccine.    HPI:  Pt is a 34 y.o. female seen today for Annual Physical Examination.She denies any acute issues.States still trying to adjust to relocation from [place] to [place].Had a baby 5 months ago. Has be home taking care of the baby sometimes stressful but not depressed.Gets out for a walk 4-5 times per week or sits outside on the porch when weather allows.     Past Medical History:  Diagnosis Date  . Gonorrhea 2010  . Medical history non-contributory    Past Surgical History:  Procedure Laterality Date  . LEEP  2013  . NO PAST SURGERIES      No Known Allergies  Allergies as of 07/03/2020   No Known Allergies     Medication List       Accurate as of July 03, 2020  9:35 AM. If you have any questions, ask your nurse or doctor.        STOP taking these medications   fluconazole 150 MG tablet Commonly known as: Diflucan Stopped by: Donalee Citrin Ayme Short, NP   miconazole 2 % powder Commonly known as: MICOTIN Stopped by: Caesar Bookman, NP   polyethylene glycol powder 17 GM/SCOOP powder Commonly known as: GLYCOLAX/MIRALAX Stopped by: Caesar Bookman, NP     TAKE these medications   acetaminophen 325 MG  tablet Commonly known as: Tylenol Take 2 tablets (650 mg total) by mouth every 4 (four) hours as needed (for pain scale < 4).   diphenhydrAMINE 25 mg capsule Commonly known as: BENADRYL Take 25 mg by mouth every 6 (six) hours as needed for allergies.   folic acid 400 MCG tablet Commonly known as: FOLVITE Take 1 mg by mouth daily.   ibuprofen 600 MG tablet Commonly known as: ADVIL Take 1 tablet (600 mg total) by mouth every 6 (six) hours.   prenatal multivitamin Tabs tablet Take 1 tablet by mouth daily at 12 noon.       Review of Systems  Constitutional: Negative for appetite change, chills, fatigue and fever.  HENT: Negative for congestion, postnasal drip, rhinorrhea, sinus pressure, sinus pain, sneezing, sore throat and trouble swallowing.   Eyes: Negative for discharge, redness and itching.       Wears eye  Glasses   Respiratory: Negative for cough, chest tightness, shortness of breath and wheezing.   Cardiovascular: Negative for chest pain, palpitations and leg swelling.  Gastrointestinal: Negative for abdominal distention, abdominal pain, constipation, diarrhea, nausea and vomiting.  Endocrine: Negative for cold intolerance, heat intolerance, polydipsia, polyphagia and polyuria.  Genitourinary: Negative for difficulty urinating, flank pain, frequency and urgency.  Musculoskeletal: Positive for arthralgias. Negative for back pain, gait problem, joint swelling, myalgias and neck pain.  Skin: Negative for color change, pallor and rash.  Neurological: Negative for dizziness, speech difficulty, weakness, light-headedness, numbness and headaches.  Hematological: Does not bruise/bleed easily.  Psychiatric/Behavioral: Negative for agitation, behavioral problems, confusion and sleep disturbance. The patient is not nervous/anxious.     Immunization History  Administered Date(s) Administered  . PFIZER SARS-COV-2 Vaccination 04/23/2020, 05/19/2020  . Tdap 01/18/2020   Pertinent   Health Maintenance Due  Topic Date Due  . PAP SMEAR-Modifier  Never done  . INFLUENZA VACCINE  Never done   Fall Risk  07/03/2020 03/06/2020  Falls in the past year? 0 0  Number falls in past yr: 0 0  Injury with Fall? 0 0   Functional Status Survey:    Vitals:   07/03/20 0908  BP: 110/70  Pulse: 76  Resp: 16  Temp: 98.2 F (36.8 C)  SpO2: 96%  Weight: 184 lb 3.2 oz (83.6 kg)  Height: 5\' 4"  (1.626 m)   Body mass index is 31.62 kg/m. Physical Exam Constitutional:      General: She is not in acute distress.    Appearance: She is obese. She is not ill-appearing.  HENT:     Right Ear: Tympanic membrane, ear canal and external ear normal. There is no impacted cerumen.     Left Ear: Tympanic membrane, ear canal and external ear normal. There is no impacted cerumen.     Nose: Nose normal. No congestion or rhinorrhea.     Mouth/Throat:     Mouth: Mucous membranes are moist.     Pharynx: Oropharynx is clear. No oropharyngeal exudate or posterior oropharyngeal erythema.  Eyes:     General: No scleral icterus.       Right eye: No discharge.        Left eye: No discharge.     Extraocular Movements: Extraocular movements intact.     Conjunctiva/sclera: Conjunctivae normal.     Pupils: Pupils are equal, round, and reactive to light.  Neck:     Vascular: No carotid bruit.  Cardiovascular:     Rate and Rhythm: Normal rate and regular rhythm.     Pulses: Normal pulses.     Heart sounds: Normal heart sounds. No murmur heard.  No friction rub. No gallop.   Pulmonary:     Effort: Pulmonary effort is normal. No respiratory distress.     Breath sounds: Normal breath sounds. No wheezing, rhonchi or rales.  Chest:     Chest wall: No tenderness.  Abdominal:     General: Bowel sounds are normal. There is no distension.     Palpations: Abdomen is soft. There is no mass.     Tenderness: There is no abdominal tenderness. There is no right CVA tenderness, left CVA tenderness, guarding or  rebound.  Musculoskeletal:        General: No swelling or tenderness. Normal range of motion.     Cervical back: Normal range of motion. No rigidity or tenderness.     Right lower leg: No edema.     Left lower leg: No edema.  Lymphadenopathy:     Cervical: No cervical adenopathy.  Skin:    General: Skin is warm and dry.     Coloration: Skin is not pale.     Findings: No bruising, erythema or rash.  Neurological:     Mental Status: She is alert and oriented to person, place, and time.     Cranial Nerves: No cranial nerve deficit.     Sensory: No sensory  deficit.     Motor: No weakness.     Coordination: Coordination normal.     Gait: Gait normal.  Psychiatric:        Mood and Affect: Mood normal.        Behavior: Behavior normal.        Thought Content: Thought content normal.        Judgment: Judgment normal.    Labs reviewed: No results for input(s): NA, K, CL, CO2, GLUCOSE, BUN, CREATININE, CALCIUM, MG, PHOS in the last 8760 hours. No results for input(s): AST, ALT, ALKPHOS, BILITOT, PROT, ALBUMIN in the last 8760 hours. Recent Labs    12/23/19 1024 01/25/20 1117 01/27/20 0625  WBC  --  9.0 25.6*  HGB 12.6 13.0 11.6*  HCT 36 38.4 34.3*  MCV  --  91.0 90.7  PLT 217 223 204   No results found for: TSH No results found for: HGBA1C No results found for: CHOL, HDL, LDLCALC, LDLDIRECT, TRIG, CHOLHDL  Significant Diagnostic Results in last 30 days:  No results found.  Assessment/Plan 1. Routine general medical examination at a health care facility Up to date with immunization.Influenza vaccine administered today. Medication reviewed.No recent labs for review.Not fasting today but will return for lab work in the morning. patient counselled regarding yearly exam, prevention of dental and periodontal disease, diet, regular sustained exercise for at least 30 minutes x 3 /week,COVID-19 hand hygiene, mask and social distancing per CDC guidelines. Proper use of sun screen and  protective clothing, recommended schedule for routine labs. - Comprehensive metabolic panel; Future - CBC with Differential; Future - Lipid panel; Future - TSH; Future  2. Need for influenza vaccination Afebrile.No upper respiratory symptoms of infection. - Flu Vaccine QUAD 6+ mos PF IM (Fluarix Quad PF) administered by CMA no reactions reported.   Family/ staff Communication: Reviewed plan of care with patient  Labs/tests ordered:  - Comprehensive metabolic panel; Future - CBC with Differential; Future - Lipid panel; Future - TSH; Future  Next Appointment : 1 year for Annual Physical Examination.Fasting labs in 1 week or sooner.   Addendum: Patient called request referral to Allergy specialist thinks has some food that she is allergic to.she would like to have an allergy test done.No rash or current reaction reported.    Caesar Bookman, NP

## 2020-07-03 NOTE — Patient Instructions (Signed)
 Health Maintenance, Female Adopting a healthy lifestyle and getting preventive care are important in promoting health and wellness. Ask your health care provider about:  The right schedule for you to have regular tests and exams.  Things you can do on your own to prevent diseases and keep yourself healthy. What should I know about diet, weight, and exercise? Eat a healthy diet   Eat a diet that includes plenty of vegetables, fruits, low-fat dairy products, and lean protein.  Do not eat a lot of foods that are high in solid fats, added sugars, or sodium. Maintain a healthy weight Body mass index (BMI) is used to identify weight problems. It estimates body fat based on height and weight. Your health care provider can help determine your BMI and help you achieve or maintain a healthy weight. Get regular exercise Get regular exercise. This is one of the most important things you can do for your health. Most adults should:  Exercise for at least 150 minutes each week. The exercise should increase your heart rate and make you sweat (moderate-intensity exercise).  Do strengthening exercises at least twice a week. This is in addition to the moderate-intensity exercise.  Spend less time sitting. Even light physical activity can be beneficial. Watch cholesterol and blood lipids Have your blood tested for lipids and cholesterol at 34 years of age, then have this test every 5 years. Have your cholesterol levels checked more often if:  Your lipid or cholesterol levels are high.  You are older than 34 years of age.  You are at high risk for heart disease. What should I know about cancer screening? Depending on your health history and family history, you may need to have cancer screening at various ages. This may include screening for:  Breast cancer.  Cervical cancer.  Colorectal cancer.  Skin cancer.  Lung cancer. What should I know about heart disease, diabetes, and high blood  pressure? Blood pressure and heart disease  High blood pressure causes heart disease and increases the risk of stroke. This is more likely to develop in people who have high blood pressure readings, are of African descent, or are overweight.  Have your blood pressure checked: ? Every 3-5 years if you are 18-39 years of age. ? Every year if you are 40 years old or older. Diabetes Have regular diabetes screenings. This checks your fasting blood sugar level. Have the screening done:  Once every three years after age 40 if you are at a normal weight and have a low risk for diabetes.  More often and at a younger age if you are overweight or have a high risk for diabetes. What should I know about preventing infection? Hepatitis B If you have a higher risk for hepatitis B, you should be screened for this virus. Talk with your health care provider to find out if you are at risk for hepatitis B infection. Hepatitis C Testing is recommended for:  Everyone born from 1945 through 1965.  Anyone with known risk factors for hepatitis C. Sexually transmitted infections (STIs)  Get screened for STIs, including gonorrhea and chlamydia, if: ? You are sexually active and are younger than 34 years of age. ? You are older than 34 years of age and your health care provider tells you that you are at risk for this type of infection. ? Your sexual activity has changed since you were last screened, and you are at increased risk for chlamydia or gonorrhea. Ask your health care provider   if you are at risk.  Ask your health care provider about whether you are at high risk for HIV. Your health care provider may recommend a prescription medicine to help prevent HIV infection. If you choose to take medicine to prevent HIV, you should first get tested for HIV. You should then be tested every 3 months for as long as you are taking the medicine. Pregnancy  If you are about to stop having your period (premenopausal) and  you may become pregnant, seek counseling before you get pregnant.  Take 400 to 800 micrograms (mcg) of folic acid every day if you become pregnant.  Ask for birth control (contraception) if you want to prevent pregnancy. Osteoporosis and menopause Osteoporosis is a disease in which the bones lose minerals and strength with aging. This can result in bone fractures. If you are 65 years old or older, or if you are at risk for osteoporosis and fractures, ask your health care provider if you should:  Be screened for bone loss.  Take a calcium or vitamin D supplement to lower your risk of fractures.  Be given hormone replacement therapy (HRT) to treat symptoms of menopause. Follow these instructions at home: Lifestyle  Do not use any products that contain nicotine or tobacco, such as cigarettes, e-cigarettes, and chewing tobacco. If you need help quitting, ask your health care provider.  Do not use street drugs.  Do not share needles.  Ask your health care provider for help if you need support or information about quitting drugs. Alcohol use  Do not drink alcohol if: ? Your health care provider tells you not to drink. ? You are pregnant, may be pregnant, or are planning to become pregnant.  If you drink alcohol: ? Limit how much you use to 0-1 drink a day. ? Limit intake if you are breastfeeding.  Be aware of how much alcohol is in your drink. In the [place] one drink equals one 12 oz bottle of beer (355 mL), one 5 oz glass of wine (148 mL), or one 1 oz glass of hard liquor (44 mL). General instructions  Schedule regular health, dental, and eye exams.  Stay current with your vaccines.  Tell your health care provider if: ? You often feel depressed. ? You have ever been abused or do not feel safe at home. Summary  Adopting a healthy lifestyle and getting preventive care are important in promoting health and wellness.  Follow your health care provider's instructions about healthy  diet, exercising, and getting tested or screened for diseases.  Follow your health care provider's instructions on monitoring your cholesterol and blood pressure. This information is not intended to replace advice given to you by your health care provider. Make sure you discuss any questions you have with your health care provider. Document Revised: 07/07/2018 Document Reviewed: 07/07/2018 Elsevier Patient Education  2020 [employer]  American Heart Association (AHA) Exercise Recommendation  Being physically active is important to prevent heart disease and stroke, the nation's No. 1and No. 5killers. To improve overall cardiovascular health, we suggest at least 150 minutes per week of moderate exercise or 75 minutes per week of vigorous exercise (or a combination of moderate and vigorous activity). Thirty minutes a day, five times a week is an easy goal to remember. You will also experience benefits even if you divide your time into two or three segments of 10 to 15 minutes per day.  For people who would benefit from lowering their blood pressure or cholesterol, we   recommend 40 minutes of aerobic exercise of moderate to vigorous intensity three to four times a week to lower the risk for heart attack and stroke.  Physical activity is anything that makes you move your body and burn calories.  This includes things like climbing stairs or playing sports. Aerobic exercises benefit your heart, and include walking, jogging, swimming or biking. Strength and stretching exercises are best for overall stamina and flexibility.  The simplest, positive change you can make to effectively improve your heart health is to start walking. It's enjoyable, free, easy, social and great exercise. A walking program is flexible and boasts high success rates because people can stick with it. It's easy for walking to become a regular and satisfying part of life.   For Overall Cardiovascular Health:  At least 30 minutes  of moderate-intensity aerobic activity at least 5 days per week for a total of 150  OR   At least 25 minutes of vigorous aerobic activity at least 3 days per week for a total of 75 minutes; or a combination of moderate- and vigorous-intensity aerobic activity  AND   Moderate- to high-intensity muscle-strengthening activity at least 2 days per week for additional health benefits.  For Lowering Blood Pressure and Cholesterol  An average 40 minutes of moderate- to vigorous-intensity aerobic activity 3 or 4 times per week  What if I can't make it to the time goal? Something is always better than nothing! And everyone has to start somewhere. Even if you've been sedentary for years, today is the day you can begin to make healthy changes in your life. If you don't think you'll make it for 30 or 40 minutes, set a reachable goal for today. You can work up toward your overall goal by increasing your time as you get stronger. Don't let all-or-nothing thinking rob you of doing what you can every day.  Source:http://www.heart.org    

## 2020-07-09 ENCOUNTER — Encounter: Payer: Self-pay | Admitting: General Practice

## 2020-07-10 ENCOUNTER — Other Ambulatory Visit: Payer: BLUE CROSS/BLUE SHIELD

## 2020-07-10 ENCOUNTER — Other Ambulatory Visit: Payer: Self-pay

## 2020-07-10 DIAGNOSIS — Z0000 Encounter for general adult medical examination without abnormal findings: Secondary | ICD-10-CM

## 2020-07-11 ENCOUNTER — Other Ambulatory Visit: Payer: Self-pay

## 2020-07-11 DIAGNOSIS — Z0000 Encounter for general adult medical examination without abnormal findings: Secondary | ICD-10-CM

## 2020-07-11 LAB — LIPID PANEL
Cholesterol: 217 mg/dL — ABNORMAL HIGH (ref <200)
HDL: 76 mg/dL (ref >=50)
LDL Cholesterol (Calc): 126 mg/dL (calc) — ABNORMAL HIGH
Non-HDL Cholesterol (Calc): 141 mg/dL (calc) — ABNORMAL HIGH (ref <130)
Total CHOL/HDL Ratio: 2.9 (calc) (ref <5.0)
Triglycerides: 63 mg/dL (ref <150)

## 2020-07-11 LAB — COMPREHENSIVE METABOLIC PANEL
AG Ratio: 1.4 (calc) (ref 1.0–2.5)
ALT: 15 U/L (ref 6–29)
AST: 19 U/L (ref 10–30)
Albumin: 4 g/dL (ref 3.6–5.1)
Alkaline phosphatase (APISO): 78 U/L (ref 31–125)
BUN: 20 mg/dL (ref 7–25)
CO2: 25 mmol/L (ref 20–32)
Calcium: 9.4 mg/dL (ref 8.6–10.2)
Chloride: 105 mmol/L (ref 98–110)
Creat: 0.71 mg/dL (ref 0.50–1.10)
Globulin: 2.8 g/dL (calc) (ref 1.9–3.7)
Glucose, Bld: 89 mg/dL (ref 65–99)
Potassium: 4.1 mmol/L (ref 3.5–5.3)
Sodium: 138 mmol/L (ref 135–146)
Total Bilirubin: 0.5 mg/dL (ref 0.2–1.2)
Total Protein: 6.8 g/dL (ref 6.1–8.1)

## 2020-07-11 LAB — CBC WITH DIFFERENTIAL/PLATELET
Absolute Monocytes: 601 cells/uL (ref 200–950)
Basophils Absolute: 39 cells/uL (ref 0–200)
Basophils Relative: 0.5 %
Eosinophils Absolute: 169 cells/uL (ref 15–500)
Eosinophils Relative: 2.2 %
HCT: 39.8 % (ref 35.0–45.0)
Hemoglobin: 13.5 g/dL (ref 11.7–15.5)
Lymphs Abs: 3088 cells/uL (ref 850–3900)
MCH: 29.7 pg (ref 27.0–33.0)
MCHC: 33.9 g/dL (ref 32.0–36.0)
MCV: 87.5 fL (ref 80.0–100.0)
MPV: 10 fL (ref 7.5–12.5)
Monocytes Relative: 7.8 %
Neutro Abs: 3804 cells/uL (ref 1500–7800)
Neutrophils Relative %: 49.4 %
Platelets: 251 10*3/uL (ref 140–400)
RBC: 4.55 10*6/uL (ref 3.80–5.10)
RDW: 13.4 % (ref 11.0–15.0)
Total Lymphocyte: 40.1 %
WBC: 7.7 10*3/uL (ref 3.8–10.8)

## 2020-07-11 LAB — TSH: TSH: 1.39 mIU/L

## 2020-07-11 NOTE — Progress Notes (Signed)
Future order completed for Fasting Lipid Panel.

## 2020-07-11 NOTE — Addendum Note (Signed)
Addended byRicharda Blade C on: 07/11/2020 04:18 PM   Modules accepted: Orders

## 2020-09-04 ENCOUNTER — Other Ambulatory Visit: Payer: Self-pay

## 2020-09-04 ENCOUNTER — Encounter: Payer: Self-pay | Admitting: Allergy & Immunology

## 2020-09-04 ENCOUNTER — Ambulatory Visit: Payer: BLUE CROSS/BLUE SHIELD | Admitting: Allergy & Immunology

## 2020-09-04 VITALS — BP 120/78 | HR 70 | Temp 97.6°F | Resp 18 | Ht 64.0 in | Wt 189.4 lb

## 2020-09-04 DIAGNOSIS — K9049 Malabsorption due to intolerance, not elsewhere classified: Secondary | ICD-10-CM

## 2020-09-04 DIAGNOSIS — J302 Other seasonal allergic rhinitis: Secondary | ICD-10-CM | POA: Diagnosis not present

## 2020-09-04 DIAGNOSIS — J3089 Other allergic rhinitis: Secondary | ICD-10-CM | POA: Diagnosis not present

## 2020-09-04 DIAGNOSIS — R21 Rash and other nonspecific skin eruption: Secondary | ICD-10-CM | POA: Diagnosis not present

## 2020-09-04 MED ORDER — CETIRIZINE HCL 10 MG PO TABS: 10.0000 mg | ORAL_TABLET | Freq: Every day | ORAL | 6 refills | Status: DC

## 2020-09-04 MED ORDER — FLUTICASONE PROPIONATE 50 MCG/ACT NA SUSP: 1.0000 | Freq: Every day | NASAL | 5 refills | Status: DC

## 2020-09-04 NOTE — Patient Instructions (Addendum)
1. Seasonal and perennial allergic rhinitis - Testing today showed: grasses, trees, indoor molds and dust mites - Copy of test results provided.  - Avoidance measures provided. - Start taking: Zyrtec (cetirizine) 10mg  tablet once daily and Flonase (fluticasone) one spray per nostril daily - You can use an extra dose of the antihistamine, if needed, for breakthrough symptoms.  - Consider nasal saline rinses 1-2 times daily to remove allergens from the nasal cavities as well as help with mucous clearance (this is especially helpful to do before the nasal sprays are given) - Consider allergy shots as a means of long-term control. - Allergy shots "re-train" and "reset" the immune system to ignore environmental allergens and decrease the resulting immune response to those allergens (sneezing, itchy watery eyes, runny nose, nasal congestion, etc).    - Allergy shots improve symptoms in 75-85% of patients.  - We can discuss more at the next appointment if the medications are not working for you.  2. Food intolerance - Testing was negative to milk and casein. - This could be lactose intolerance rather than a milk allergy. - Try using only Lactaid and you can even use the Lactaid pills. - Mango reaction was likely related to pesticide exposure since you eat other mangoes without a problem.   3. Rash - This might be related to contact dermatitis from your rings. - We could do patch testing in the future to clarify this more.   4. Return in about 2 months (around 11/02/2020).    Please inform 01/02/2021 of any Emergency Department visits, hospitalizations, or changes in symptoms. Call us before going to the ED for breathing or allergy symptoms since we might be able to fit you in for a sick visit. Feel free to contact us anytime with any questions, problems, or concerns.  It was a pleasure to meet you today!  Websites that have reliable patient information: 1. American Academy of Asthma, Allergy, and  Immunology: www.aaaai.org 2. Food Allergy Research and Education (FARE): foodallergy.org 3. Mothers of Asthmatics: http://www.asthmacommunitynetwork.org 4. American College of Allergy, Asthma, and Immunology: www.acaai.org   COVID-19 Vaccine Information can be found at: [place] For questions related to vaccine distribution or appointments, please email [email or web address] or call [phone].     "Like" [phone] on Facebook and Instagram for our latest updates!       Make sure you are registered to vote! If you have moved or changed any of your contact information, you will need to get this updated before voting!  In some cases, you MAY be able to register to vote online: [place]    Reducing Pollen Exposure  The American Academy of Allergy, Asthma and Immunology suggests the following steps to reduce your exposure to pollen during allergy seasons.    1. Do not hang sheets or clothing out to dry; pollen may collect on these items. 2. Do not mow lawns or spend time around freshly cut grass; mowing stirs up pollen. 3. Keep windows closed at night.  Keep car windows closed while driving. 4. Minimize morning activities outdoors, a time when pollen counts are usually at their highest. 5. Stay indoors as much as possible when pollen counts or humidity is high and on windy days when pollen tends to remain in the air longer. 6. Use air conditioning when possible.  Many air conditioners have filters that trap the pollen spores. 7. Use a HEPA room air filter to remove pollen form the indoor air you breathe.  Control of Mold Allergen  Mold and fungi can grow on a variety of surfaces provided certain temperature and moisture conditions exist.  Outdoor molds grow on plants, decaying vegetation and soil.  The major outdoor mold, Alternaria and Cladosporium, are found in very high numbers  during hot and dry conditions.  Generally, a late Summer - Fall peak is seen for common outdoor fungal spores.  Rain will temporarily lower outdoor mold spore count, but counts rise rapidly when the rainy period ends.  The most important indoor molds are Aspergillus and Penicillium.  Dark, humid and poorly ventilated basements are ideal sites for mold growth.  The next most common sites of mold growth are the bathroom and the kitchen.  Outdoor (Seasonal) Mold Control  Positive outdoor molds via skin testing: Bipolaris (Helminthsporium), Drechslera (Curvalaria) and Mucor  1. Use air conditioning and keep windows closed 2. Avoid exposure to decaying vegetation. 3. Avoid leaf raking. 4. Avoid grain handling. 5. Consider wearing a face mask if working in moldy areas.  6.   Indoor (Perennial) Mold Control   Positive indoor molds via skin testing: Fusarium, Aureobasidium (Pullulara) and Rhizopus  1. Maintain humidity below 50%. 2. Clean washable surfaces with 5% bleach solution. 3. Remove sources e.g. contaminated carpets.     Control of Dust Mite Allergen    Dust mites play a major role in allergic asthma and rhinitis.  They occur in environments with high humidity wherever human skin is found.  Dust mites absorb humidity from the atmosphere (ie, they do not drink) and feed on organic matter (including shed human and animal skin).  Dust mites are a microscopic type of insect that you cannot see with the naked eye.  High levels of dust mites have been detected from mattresses, pillows, carpets, upholstered furniture, bed covers, clothes, soft toys and any woven material.  The principal allergen of the dust mite is found in its feces.  A gram of dust may contain 1,000 mites and 250,000 fecal particles.  Mite antigen is easily measured in the air during house cleaning activities.  Dust mites do not bite and do not cause harm to humans, other than by triggering allergies/asthma.    Ways to  decrease your exposure to dust mites in your home:  1. Encase mattresses, box springs and pillows with a mite-impermeable barrier or cover   2. Wash sheets, blankets and drapes weekly in hot water (130 F) with detergent and dry them in a dryer on the hot setting.  3. Have the room cleaned frequently with a vacuum cleaner and a damp dust-mop.  For carpeting or rugs, vacuuming with a vacuum cleaner equipped with a high-efficiency particulate air (HEPA) filter.  The dust mite allergic individual should not be in a room which is being cleaned and should wait 1 hour after cleaning before going into the room. 4. Do not sleep on upholstered furniture (eg, couches).   5. If possible removing carpeting, upholstered furniture and drapery from the home is ideal.  Horizontal blinds should be eliminated in the rooms where the person spends the most time (bedroom, study, television room).  Washable vinyl, roller-type shades are optimal. 6. Remove all non-washable stuffed toys from the bedroom.  Wash stuffed toys weekly like sheets and blankets above.   7. Reduce indoor humidity to less than 50%.  Inexpensive humidity monitors can be purchased at most hardware stores.  Do not use a humidifier as can make the problem worse and are not recommended.

## 2020-09-04 NOTE — Progress Notes (Signed)
NEW PATIENT  Date of Service/Encounter:  09/04/20  Referring provider: Ngetich, Donalee Citrin, NP   Assessment:   Seasonal and perennial allergic rhinitis (grasses, trees, indoor molds and dust mites)  Food intolerance  Rash  Plan/Recommendations:   1. Seasonal and perennial allergic rhinitis - Testing today showed: grasses, trees, indoor molds and dust mites - Copy of test results provided.  - Avoidance measures provided. - Start taking: Zyrtec (cetirizine) 10mg  tablet once daily and Flonase (fluticasone) one spray per nostril daily - You can use an extra dose of the antihistamine, if needed, for breakthrough symptoms.  - Consider nasal saline rinses 1-2 times daily to remove allergens from the nasal cavities as well as help with mucous clearance (this is especially helpful to do before the nasal sprays are given) - Consider allergy shots as a means of long-term control. - Allergy shots "re-train" and "reset" the immune system to ignore environmental allergens and decrease the resulting immune response to those allergens (sneezing, itchy watery eyes, runny nose, nasal congestion, etc).    - Allergy shots improve symptoms in 75-85% of patients.  - We can discuss more at the next appointment if the medications are not working for you.  2. Food intolerance - Testing was negative to milk and casein. - This could be lactose intolerance rather than a milk allergy. - Try using only Lactaid and you can even use the Lactaid pills. - Mango reaction was likely related to pesticide exposure since you eat other mangoes without a problem.   3. Rash - This might be related to contact dermatitis from your rings. - We could do patch testing in the future to clarify this more.   4. Return in about 2 months (around 11/02/2020).   Subjective:   Kathryn Mack is a 35 y.o. female presenting today for evaluation of  Chief Complaint  Patient presents with  . Allergy Testing    Sometimes has  bad seasonal allergies, also noticed super dry skin and bloating from eating dairy   . Rash    Has had heat blisters/hives during summertime - side of neck on really hot days     Fatina Barrero has a history of the following: Patient Active Problem List   Diagnosis Date Noted  . Full-term premature rupture of membranes 01/25/2020  . Supervision of normal first pregnancy, antepartum 01/09/2020    History obtained from: chart review and patient.  Tangela Gruwell was referred by Bradly Bienenstock, NP.     Kathryn Mack is a 35 y.o. female presenting for an evaluation of allergic reactions.  She reports that she will get some dry patches on her skin. She notes that she has some irritation to certain things in certain places around her ears for instance. It is pruritic. Sometimes she will get dry nose and congestion making it hard for her to breathe.   Allergic Rhinitis Symptom History: She has noticed that her symptoms are worse since she gave birth to her son in July. She has noticed that she has been having increased reactivity to certain environments. She has had environmental allergies for a number of years and is controlled with cleaning etc.  She has never bene tested in the past. She mostly grew up in [place]. She moved here because of her husband. She got here in June 2021. She treats it with Benadryl and cleaning. She has an air purifier and a humidifier to help with the pollen/dander.   Food Allergy Symptom History: She notes  that dairy can cause some nausea. She loves hcocolate and has been avoiding it because of this. She tolerates peanut, tree nuts, pasta, eggs, and seafood. Her symptoms are mostly nausea and stomach pains, but this is only with dairy. She has not noted anything with soy or sesame. She did notice that she had some itching in her mouth from eating a mango. This one was from [place], but she tolerates mangos from other places in the world.   Eczema Symptom History: She  does note a heat rash when she is very hot or stressed/anxious. She will have flares behind her ears. She does not use anything at all. She has hydrocortisone but she does not use it at all.  Otherwise, there is no history of other atopic diseases, including drug allergies, stinging insect allergies, urticaria or contact dermatitis. There is no significant infectious history. Vaccinations are up to date.    Past Medical History: Patient Active Problem List   Diagnosis Date Noted  . Full-term premature rupture of membranes 01/25/2020  . Supervision of normal first pregnancy, antepartum 01/09/2020    Medication List:  Allergies as of 09/04/2020   No Known Allergies     Medication List       Accurate as of September 04, 2020 11:49 AM. If you have any questions, ask your nurse or doctor.        acetaminophen 325 MG tablet Commonly known as: Tylenol Take 2 tablets (650 mg total) by mouth every 4 (four) hours as needed (for pain scale < 4).   cetirizine 10 MG tablet Commonly known as: ZYRTEC Take 1 tablet (10 mg total) by mouth daily. Started by: Alfonse Spruce, MD   diphenhydrAMINE 25 mg capsule Commonly known as: BENADRYL Take 25 mg by mouth every 6 (six) hours as needed for allergies.   fluticasone 50 MCG/ACT nasal spray Commonly known as: FLONASE Place 1 spray into both nostrils daily. Started by: Alfonse Spruce, MD   folic acid 400 MCG tablet Commonly known as: FOLVITE Take 1 mg by mouth daily.   ibuprofen 600 MG tablet Commonly known as: ADVIL Take 1 tablet (600 mg total) by mouth every 6 (six) hours.   prenatal multivitamin Tabs tablet Take 1 tablet by mouth daily at 12 noon.       Birth History: non-contributory  Developmental History: non-contributory  Past Surgical History: Past Surgical History:  Procedure Laterality Date  . LEEP  2013  . NO PAST SURGERIES       Family History: Family History  Problem Relation Age of Onset  . High  blood pressure Mother   . Arthritis Father   . Hypertension Father      Social History: Elnora lives at home with her husband as well as her one year son. She lives in a house that is 71 years old. There is wood throughout the home. She has electric heating and central cooling. There are dogs inside of the home. There are no dust mite covers on the bedding. There is no tobacco exposure. 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 She is exposed to fumes, chemicals, and dust. She has no hobbies that expose her to any of these items. She does use a HEPA filter in the home. There is no smoking exposure.   Review of Systems  Constitutional: Negative.  Negative for chills, fever, malaise/fatigue and weight loss.  HENT: Positive for congestion. Negative for ear discharge, ear  pain and sinus pain.   Eyes: Negative for pain, discharge and redness.  Respiratory: Negative for cough, sputum production, shortness of breath and wheezing.   Cardiovascular: Negative.  Negative for chest pain and palpitations.  Gastrointestinal: Negative for abdominal pain, constipation, diarrhea, heartburn and vomiting.  Skin: Positive for itching and rash.  Neurological: Negative for dizziness and headaches.  Endo/Heme/Allergies: Positive for environmental allergies. Does not bruise/bleed easily.       Objective:   Blood pressure 120/78, pulse 70, temperature 97.6 F (36.4 C), resp. rate 18, height 5\' 4"  (1.626 m), weight 189 lb 6.4 oz (85.9 kg), SpO2 97 %, currently breastfeeding. Body mass index is 32.51 kg/m.   Physical Exam:   Physical Exam Constitutional:      Appearance: She is well-developed.     Comments: Very talkative.  HENT:     Head: Normocephalic and atraumatic.     Right Ear: Tympanic membrane, ear canal and external ear normal. No drainage, swelling or tenderness. Tympanic membrane is not injected, scarred, erythematous, retracted or bulging.      Left Ear: Tympanic membrane, ear canal and external ear normal. No drainage, swelling or tenderness. Tympanic membrane is not injected, scarred, erythematous, retracted or bulging.     Nose: No nasal deformity, septal deviation, mucosal edema, rhinorrhea or epistaxis.     Right Turbinates: Enlarged and swollen.     Left Turbinates: Enlarged and swollen.     Right Sinus: No maxillary sinus tenderness or frontal sinus tenderness.     Left Sinus: No maxillary sinus tenderness or frontal sinus tenderness.     Comments: No nasal polyps.  Moderate clear discharge bilaterally.    Mouth/Throat:     Mouth: Oropharynx is clear and moist. Mucous membranes are not pale and not dry.     Pharynx: Uvula midline.  Eyes:     General:        Right eye: No discharge.        Left eye: No discharge.     Extraocular Movements: EOM normal.     Conjunctiva/sclera: Conjunctivae normal.     Right eye: Right conjunctiva is not injected. No chemosis.    Left eye: Left conjunctiva is not injected. No chemosis.    Pupils: Pupils are equal, round, and reactive to light.  Cardiovascular:     Rate and Rhythm: Normal rate and regular rhythm.     Heart sounds: Normal heart sounds.  Pulmonary:     Effort: Pulmonary effort is normal. No tachypnea, accessory muscle usage or respiratory distress.     Breath sounds: Normal breath sounds. No wheezing, rhonchi or rales.     Comments: Moving air well in all lung fields. Chest:     Chest wall: No tenderness.  Abdominal:     Tenderness: There is no abdominal tenderness. There is no guarding or rebound.  Lymphadenopathy:     Head:     Right side of head: No submandibular, tonsillar or occipital adenopathy.     Left side of head: No submandibular, tonsillar or occipital adenopathy.     Cervical: No cervical adenopathy.  Skin:    General: Skin is warm.     Capillary Refill: Capillary refill takes less than 2 seconds.     Coloration: Skin is not pale.     Findings: No  abrasion, erythema, petechiae or rash. Rash is not papular, urticarial or vesicular.     Comments: No eczematous or urticarial lesions noted. There is some thickened skin behind the  bilateral ears.   Neurological:     Mental Status: She is alert.  Psychiatric:        Mood and Affect: Mood and affect normal.      Diagnostic studies:   Allergy Studies:     Airborne Adult Perc - 09/04/20 0958    Time Antigen Placed 0950    Allergen Manufacturer Waynette Buttery    Location Back    Number of Test 59    Panel 1 Select    1. Control-Buffer 50% Glycerol Negative    2. Control-Histamine 1 mg/ml 3+    3. Albumin saline Negative    4. Bahia Negative    5. French Southern Territories Negative    6. Johnson Negative    7. Kentucky Blue Negative    8. Meadow Fescue Negative    9. Perennial Rye Negative    10. Sweet Vernal Negative    11. Timothy Negative    12. Cocklebur Negative    13. Burweed Marshelder Negative    14. Ragweed, short Negative    15. Ragweed, Giant Negative    16. Plantain,  English Negative    17. Lamb's Quarters Negative    18. Sheep Sorrell Negative    19. Rough Pigweed Negative    20. Marsh Elder, Rough Negative    21. Mugwort, Common Negative    22. Ash mix Negative    23. Birch mix Negative    24. Beech American Negative    25. Box, Elder Negative    26. Cedar, red Negative    27. Cottonwood, Guinea-Bissau Negative    28. Elm mix Negative    29. Hickory Negative    30. Maple mix Negative    31. Oak, Guinea-Bissau mix Negative    32. Pecan Pollen Negative    33. Pine mix Negative    34. Sycamore Eastern Negative    35. Walnut, Black Pollen Negative    36. Alternaria alternata Negative    37. Cladosporium Herbarum Negative    38. Aspergillus mix Negative    39. Penicillium mix Negative    40. Bipolaris sorokiniana (Helminthosporium) Negative    41. Drechslera spicifera (Curvularia) Negative    42. Mucor plumbeus Negative    43. Fusarium moniliforme Negative    44. Aureobasidium pullulans  (pullulara) Negative    45. Rhizopus oryzae Negative    46. Botrytis cinera Negative    47. Epicoccum nigrum Negative    48. Phoma betae Negative    49. Candida Albicans Negative    50. Trichophyton mentagrophytes Negative    51. Mite, D Farinae  5,000 AU/ml Negative    52. Mite, D Pteronyssinus  5,000 AU/ml Negative    53. Cat Hair 10,000 BAU/ml Negative    54.  Dog Epithelia Negative    55. Mixed Feathers Negative    56. Horse Epithelia Negative    57. Cockroach, German Negative    58. Mouse Negative    59. Tobacco Leaf Negative          Food Perc - 09/04/20 0959      Test Information   Time Antigen Placed 2992    Allergen Manufacturer Waynette Buttery    Location Back    Number of allergen test 3    Food Select      Food   1. Peanut Omitted    2. Soybean food Omitted    3. Wheat, whole Omitted    4. Sesame Omitted    5. Milk, cow Negative  6. Egg White, chicken Omitted    7. Casein Negative    8. Shellfish mix Omitted    9. Fish mix Omitted    10. Cashew Omitted          Intradermal - 09/04/20 1016    Time Antigen Placed 1016    Allergen Manufacturer Waynette ButteryGreer    Location Arm    Number of Test 15    Intradermal Select    Control Negative    French Southern TerritoriesBermuda Negative    Johnson 4+    7 Grass Negative    Ragweed mix Negative    Weed mix Negative    Tree mix 2+    Mold 1 Negative    Mold 2 2+    Mold 3 Negative    Mold 4 2+    Cat Negative    Dog Negative    Cockroach Negative    Mite mix 4+           Allergy testing results were read and interpreted by myself, documented by clinical staff.         Malachi BondsJoel Kraig Genis, MD [facility]

## 2020-11-06 ENCOUNTER — Ambulatory Visit: Payer: BLUE CROSS/BLUE SHIELD | Admitting: Allergy & Immunology

## 2020-11-12 ENCOUNTER — Other Ambulatory Visit: Payer: BLUE CROSS/BLUE SHIELD

## 2020-11-12 DIAGNOSIS — Z0000 Encounter for general adult medical examination without abnormal findings: Secondary | ICD-10-CM

## 2020-11-13 ENCOUNTER — Ambulatory Visit (INDEPENDENT_AMBULATORY_CARE_PROVIDER_SITE_OTHER): Payer: Medicaid Other | Admitting: Family

## 2020-11-13 ENCOUNTER — Other Ambulatory Visit: Payer: Self-pay

## 2020-11-13 ENCOUNTER — Ambulatory Visit: Payer: BLUE CROSS/BLUE SHIELD | Admitting: Family

## 2020-11-13 ENCOUNTER — Encounter: Payer: Self-pay | Admitting: Family

## 2020-11-13 VITALS — BP 110/88 | Temp 97.7°F | Resp 20 | Ht 64.0 in | Wt 191.4 lb

## 2020-11-13 DIAGNOSIS — E785 Hyperlipidemia, unspecified: Secondary | ICD-10-CM

## 2020-11-13 DIAGNOSIS — Z6832 Body mass index (BMI) 32.0-32.9, adult: Secondary | ICD-10-CM

## 2020-11-13 NOTE — Progress Notes (Signed)
Provider: Richarda Blade FNP-C  Tavita Eastham, Donalee Citrin, NP  Patient Care Team: Reise Hietala, Donalee Citrin, NP as PCP - General (Family Medicine)  Extended Emergency Contact Information Primary Emergency Contact: Mitchell,Darnell Address: [address]          [place], [place] [address] [place] Phone: [phone] Relation: Spouse  Code Status:  Full code  Goals of care: Advanced Directive information Advanced Directives 11/13/2020  Does Patient Have a Medical Advance Directive? No  Would patient like information on creating a medical advance directive? No - Patient declined     Chief Complaint  Patient presents with  . Medical Management of Chronic Issues    4 Month Follow Up    HPI:  Pt is a 35 y.o. Mack seen today for an acute visit follow up high cholesterol.Had lipid panel schedule for yesterday but states did not look at the appointment.she will reschedule for tomorrow.Has been busy taking care of the baby. Has changed her diet to include veggies in diet. Also exercises by walking at least 30 minutes though states needs to be consistent with her exercise.  Previous cholesterol 217,TRG normal and LDL 126     Past Medical History:  Diagnosis Date  . Gonorrhea 2010  . Medical history non-contributory    Past Surgical History:  Procedure Laterality Date  . LEEP  2013  . NO PAST SURGERIES      Allergies  Allergen Reactions  . Grass Pollen(K-O-[id number]-T-Swt Vern)     Outpatient Encounter Medications as of 11/13/2020  Medication Sig  . acetaminophen (TYLENOL) 325 MG tablet Take 2 tablets (650 mg total) by mouth every 4 (four) hours as needed (for pain scale < 4).  . cetirizine (ZYRTEC) 10 MG tablet Take 1 tablet (10 mg total) by mouth daily.  . diphenhydrAMINE (BENADRYL) 25 mg capsule Take 25 mg by mouth every 6 (six) hours as needed for allergies.  . fluticasone (FLONASE) 50 MCG/ACT nasal spray Place 1 spray into both nostrils daily.  Marland Kitchen ibuprofen (ADVIL) 600  MG tablet Take 1 tablet (600 mg total) by mouth every 6 (six) hours.  . folic acid (FOLVITE) 400 MCG tablet Take 1 mg by mouth daily.  (Patient not taking: No sig reported)  . [DISCONTINUED] Prenatal Vit-Fe Fumarate-FA (PRENATAL MULTIVITAMIN) TABS tablet Take 1 tablet by mouth daily at 12 noon. (Patient not taking: Reported on 09/04/2020)   No facility-administered encounter medications on file as of 11/13/2020.    Review of Systems  Constitutional: Negative for appetite change, chills, fatigue, fever and unexpected weight change.  HENT: Negative for congestion, dental problem, ear discharge, ear pain, facial swelling, hearing loss, nosebleeds, postnasal drip, rhinorrhea, sinus pressure, sinus pain, sneezing, sore throat, tinnitus and trouble swallowing.   Eyes: Negative for pain, discharge, redness, itching and visual disturbance.  Respiratory: Negative for cough, chest tightness, shortness of breath and wheezing.   Cardiovascular: Negative for chest pain, palpitations and leg swelling.  Gastrointestinal: Negative for abdominal distention, abdominal pain, blood in stool, constipation, diarrhea, nausea and vomiting.  Endocrine: Negative for cold intolerance, heat intolerance, polydipsia, polyphagia and polyuria.  Genitourinary: Negative for difficulty urinating, dysuria, flank pain, frequency and urgency.  Musculoskeletal: Negative for arthralgias, back pain, gait problem, joint swelling, myalgias, neck pain and neck stiffness.  Skin: Negative for color change, pallor, rash and wound.  Neurological: Negative for dizziness, syncope, speech difficulty, weakness, light-headedness, numbness and headaches.  Hematological: Does not bruise/bleed easily.  Psychiatric/Behavioral: Negative for agitation, behavioral problems, confusion, hallucinations,  self-injury, sleep disturbance and suicidal ideas. The patient is not nervous/anxious.     Immunization History  Administered Date(s) Administered  .  Influenza,inj,Quad PF,6+ Mos 07/03/2020  . PFIZER(Purple Top)SARS-COV-2 Vaccination 04/23/2020, 05/19/2020  . Tdap 01/18/2020   Pertinent  Health Maintenance Due  Topic Date Due  . PAP SMEAR-Modifier  Never done  . INFLUENZA VACCINE  02/25/2021   Fall Risk  07/03/2020 03/06/2020  Falls in the past year? 0 0  Number falls in past yr: 0 0  Injury with Fall? 0 0   Functional Status Survey:    Vitals:   11/13/20 1513  BP: 110/88  Resp: 20  Temp: 97.7 F (36.5 C)  TempSrc: Temporal  Weight: 191 lb 6.4 oz (86.8 kg)  Height: 5\' 4"  (1.626 m)   Body mass index is 32.85 kg/m. Physical Exam Vitals reviewed.  Constitutional:      General: She is not in acute distress.    Appearance: Normal appearance. She is obese. She is not ill-appearing or diaphoretic.  HENT:     Head: Normocephalic.     Mouth/Throat:     Mouth: Mucous membranes are moist.     Pharynx: Oropharynx is clear. No oropharyngeal exudate or posterior oropharyngeal erythema.  Eyes:     General: No scleral icterus.       Right eye: No discharge.        Left eye: No discharge.     Conjunctiva/sclera: Conjunctivae normal.     Pupils: Pupils are equal, round, and reactive to light.  Neck:     Vascular: No carotid bruit.  Cardiovascular:     Rate and Rhythm: Normal rate and regular rhythm.     Pulses: Normal pulses.     Heart sounds: Normal heart sounds. No murmur heard. No friction rub. No gallop.   Pulmonary:     Effort: Pulmonary effort is normal. No respiratory distress.     Breath sounds: Normal breath sounds. No wheezing, rhonchi or rales.  Chest:     Chest wall: No tenderness.  Abdominal:     General: Bowel sounds are normal. There is no distension.     Palpations: Abdomen is soft. There is no mass.     Tenderness: There is no abdominal tenderness. There is no right CVA tenderness, left CVA tenderness, guarding or rebound.  Musculoskeletal:        General: No swelling or tenderness. Normal range of  motion.     Cervical back: Normal range of motion. No rigidity or tenderness.     Right lower leg: No edema.     Left lower leg: No edema.  Lymphadenopathy:     Cervical: No cervical adenopathy.  Skin:    General: Skin is warm and dry.     Coloration: Skin is not pale.     Findings: No erythema.  Neurological:     Mental Status: She is alert and oriented to person, place, and time.     Cranial Nerves: No cranial nerve deficit.     Sensory: No sensory deficit.     Motor: No weakness.     Coordination: Coordination normal.     Gait: Gait normal.  Psychiatric:        Mood and Affect: Mood normal.        Speech: Speech normal.        Behavior: Behavior normal.        Thought Content: Thought content normal.        Judgment: Judgment normal.  Labs reviewed: Recent Labs    07/10/20 0916  NA 138  K 4.1  CL 105  CO2 25  GLUCOSE 89  BUN 20  CREATININE 0.71  CALCIUM 9.4   Recent Labs    07/10/20 0916  AST 19  ALT 15  BILITOT 0.5  PROT 6.8   Recent Labs    01/25/20 1117 01/27/20 0625 07/10/20 0916  WBC 9.0 25.6* 7.7  NEUTROABS  --   --  3,804  HGB 13.0 11.6* 13.5  HCT 38.4 34.3* 39.8  MCV 91.0 90.7 87.5  PLT 223 204 251   Lab Results  Component Value Date   TSH 1.39 07/10/2020   No results found for: HGBA1C Lab Results  Component Value Date   CHOL 217 (H) 07/10/2020   HDL 76 07/10/2020   LDLCALC 126 (H) 07/10/2020   TRIG 63 07/10/2020   CHOLHDL 2.9 07/10/2020    Significant Diagnostic Results in last 30 days:  No results found.  Assessment/Plan  1. Hyperlipidemia LDL goal <100 Previous LDL 126 was due for rechecked yesterday but forgot appointment will reschedule lipid panel for tomorrow.  - continue with dietary modification and exercise at least 3 times per week for 30 minutes.  2. Body mass index (BMI) of 32.0-32.9 in adult BMI 32.85  Obese  - continue on dietary modification and exercise as above.   Family/ staff Communication:  Reviewed plan of care with patient verbalized understanding.  Labs/tests ordered: Has lipid panel pending.   Next Appointment: Has appointment in December for Physical examination   Caesar Bookman, NP

## 2020-11-27 ENCOUNTER — Encounter: Payer: Self-pay | Admitting: *Deleted

## 2020-11-29 ENCOUNTER — Encounter: Payer: Self-pay | Admitting: *Deleted

## 2021-03-07 ENCOUNTER — Encounter: Payer: Self-pay | Admitting: Obstetrics and Gynecology

## 2021-03-07 ENCOUNTER — Other Ambulatory Visit: Payer: Self-pay

## 2021-03-07 ENCOUNTER — Ambulatory Visit (INDEPENDENT_AMBULATORY_CARE_PROVIDER_SITE_OTHER): Payer: Medicaid Other | Admitting: Obstetrics and Gynecology

## 2021-03-07 VITALS — BP 126/81 | HR 86 | Temp 98.2°F | Ht 64.0 in | Wt 183.4 lb

## 2021-03-07 DIAGNOSIS — Z3201 Encounter for pregnancy test, result positive: Secondary | ICD-10-CM | POA: Diagnosis not present

## 2021-03-07 DIAGNOSIS — Z3480 Encounter for supervision of other normal pregnancy, unspecified trimester: Secondary | ICD-10-CM | POA: Insufficient documentation

## 2021-03-07 LAB — POCT URINE PREGNANCY: Preg Test, Ur: POSITIVE — AB

## 2021-03-07 MED ORDER — BLOOD PRESSURE MONITOR AUTOMAT DEVI: 1.0000 | Freq: Every day | 0 refills | Status: DC

## 2021-03-07 MED ORDER — GOJJI WEIGHT SCALE MISC: 1.0000 | Freq: Every day | 0 refills | Status: DC | PRN

## 2021-03-08 ENCOUNTER — Encounter: Payer: Self-pay | Admitting: Obstetrics and Gynecology

## 2021-03-08 NOTE — Progress Notes (Signed)
Patient reported being 16 days late for menses; LMP 01/20/2021. (+) UPT in office. [redacted]w[redacted]d by LMP. Will proceed with a NOB OB RN intake visit instead of AEX today. Will perform PE with NOB visit.   [name], CNM

## 2021-03-08 NOTE — Progress Notes (Signed)
   Location: [facility] Patient: clinic Provider: clinic  PRENATAL INTAKE SUMMARY  Kathryn Mack presents today New OB Nurse Interview.  OB History     Gravida  2   Para  1   Term  1   Preterm      AB      Living  1      SAB      IAB      Ectopic      Multiple  0   Live Births  1          I have reviewed the patient's medical, obstetrical, social, and family histories, medications, and available lab results.  SUBJECTIVE She has no unusual complaints  OBJECTIVE Initial Nurse interview for history/labs (New OB)  EDD: 10/27/2021 by LMP GA: [redacted]w[redacted]d G2P1001 FHT: not assessed due to gestational age  GENERAL APPEARANCE: alert, well appearing, in no apparent distress, oriented to person, place and time   ASSESSMENT Positive UPT Normal pregnancy  PLAN Prenatal care:  [facility] PAP needed Rx for BP monitor and weight scale sent to [facility] Labs to be completed at next visit with Kathryn Mack, CNM 04/04/2021  Follow Up Instructions:   I discussed the assessment and treatment plan with the patient. The patient was provided an opportunity to ask questions and all were answered. The patient agreed with the plan and demonstrated an understanding of the instructions.   The patient was advised to call back or seek an in-person evaluation if the symptoms worsen or if the condition fails to improve as anticipated.  I provided 30 minutes of  face-to-face time during this encounter.  Clovis Pu, RN

## 2021-03-21 ENCOUNTER — Inpatient Hospital Stay: Admission: RE | Admit: 2021-03-21 | Payer: Medicaid Other | Source: Ambulatory Visit

## 2021-03-25 ENCOUNTER — Other Ambulatory Visit: Payer: Self-pay | Admitting: Obstetrics and Gynecology

## 2021-03-25 ENCOUNTER — Telehealth: Payer: Self-pay

## 2021-03-25 ENCOUNTER — Ambulatory Visit
Admission: RE | Admit: 2021-03-25 | Discharge: 2021-03-25 | Disposition: A | Discharge status: Home / Self Care | Payer: Medicaid Other | Source: Ambulatory Visit | Attending: Obstetrics and Gynecology | Admitting: Obstetrics and Gynecology

## 2021-03-25 ENCOUNTER — Other Ambulatory Visit: Payer: Self-pay

## 2021-03-25 DIAGNOSIS — Z3A09 9 weeks gestation of pregnancy: Secondary | ICD-10-CM | POA: Diagnosis not present

## 2021-03-25 DIAGNOSIS — Z3480 Encounter for supervision of other normal pregnancy, unspecified trimester: Secondary | ICD-10-CM

## 2021-03-25 DIAGNOSIS — Z3689 Encounter for other specified antenatal screening: Secondary | ICD-10-CM | POA: Diagnosis not present

## 2021-03-25 NOTE — Telephone Encounter (Signed)
Attempted to call patient to review ultrasound results. Both numbers tried with no availability to leave message.   Rolm Bookbinder, CNM 03/25/21 3:18 PM

## 2021-04-04 ENCOUNTER — Other Ambulatory Visit (HOSPITAL_COMMUNITY)
Admission: RE | Admit: 2021-04-04 | Discharge: 2021-04-04 | Disposition: A | Discharge status: Home / Self Care | Payer: Medicaid Other | Source: Ambulatory Visit | Attending: Obstetrics and Gynecology | Admitting: Obstetrics and Gynecology

## 2021-04-04 ENCOUNTER — Encounter: Payer: Self-pay | Admitting: Obstetrics and Gynecology

## 2021-04-04 ENCOUNTER — Other Ambulatory Visit: Payer: Self-pay

## 2021-04-04 ENCOUNTER — Encounter: Payer: Self-pay | Admitting: *Deleted

## 2021-04-04 ENCOUNTER — Ambulatory Visit (INDEPENDENT_AMBULATORY_CARE_PROVIDER_SITE_OTHER): Payer: Medicaid Other | Admitting: Obstetrics and Gynecology

## 2021-04-04 VITALS — BP 130/80 | HR 105 | Temp 98.2°F | Wt 181.4 lb

## 2021-04-04 DIAGNOSIS — O169 Unspecified maternal hypertension, unspecified trimester: Secondary | ICD-10-CM | POA: Diagnosis not present

## 2021-04-04 DIAGNOSIS — Z3A10 10 weeks gestation of pregnancy: Secondary | ICD-10-CM

## 2021-04-04 DIAGNOSIS — Z3481 Encounter for supervision of other normal pregnancy, first trimester: Secondary | ICD-10-CM | POA: Diagnosis not present

## 2021-04-04 DIAGNOSIS — Z3480 Encounter for supervision of other normal pregnancy, unspecified trimester: Secondary | ICD-10-CM | POA: Insufficient documentation

## 2021-04-04 DIAGNOSIS — Z31430 Encounter of female for testing for genetic disease carrier status for procreative management: Secondary | ICD-10-CM | POA: Diagnosis not present

## 2021-04-04 DIAGNOSIS — L03211 Cellulitis of face: Secondary | ICD-10-CM | POA: Insufficient documentation

## 2021-04-04 NOTE — Progress Notes (Signed)
INITIAL OBSTETRICAL VISIT Patient name: Kathryn Mack MRN [id number]  Date of birth: 04/21/1986 Chief Complaint:   Initial Prenatal Visit  History of Present Illness:   Kathryn Mack is a 35 y.o. G2P1001 Hispanic/African-American female at 86w4dby 9 wk U/S with an Estimated Date of Delivery: 10/27/21 being seen today for her initial obstetrical visit.  Her obstetrical history is significant for  closely spaced pregnancy . This is an unplanned pregnancy. She and the father of the baby ([name])/husband live together. She has a support system that consists of her husband/family/friends. Today she reports no complaints.   Patient's last menstrual period was 01/20/2021. Last pap 06/21/2019 Results were: normal Review of Systems:   Pertinent items are noted in HPI Denies cramping/contractions, leakage of fluid, vaginal bleeding, abnormal vaginal discharge w/ itching/odor/irritation, headaches, visual changes, shortness of breath, chest pain, abdominal pain, severe nausea/vomiting, or problems with urination or bowel movements unless otherwise stated above.  Pertinent History Reviewed:  Reviewed past medical,surgical, social, obstetrical and family history.  Reviewed problem list, medications and allergies. OB History  Gravida Para Term Preterm AB Living  _0 SAB IAB Ectopic Multiple Live Births        0 1    # Outcome Date GA Lbr Len/2nd Weight Sex Delivery Anes PTL Lv  2 Current           1 Term 01/26/20 [id number]:31 / 00:21 6 lb 8.9 oz (2.974 kg) M Vag-Spont EPI  LIV   Physical Assessment:   Vitals:   04/04/21 1358 04/04/21 1423  BP: 132/89 130/80  Pulse: (!) 105   Temp: 98.2 F (36.8 C)   Weight: 181 lb 6.4 oz (82.3 kg)   Body mass index is 31.14 kg/m.       Physical Examination:  General appearance - well appearing, and in no distress  Mental status - alert, oriented to person, place, and time  Psych:  She has a normal mood and affect  Skin - warm and dry,  normal color, no suspicious lesions noted  Chest - effort normal, all lung fields clear to auscultation bilaterally  Heart - normal rate and regular rhythm  Abdomen - soft, nontender  Extremities:  No swelling or varicosities noted  Pelvic - deferred   FHTs by doppler: 132 bpm  Assessment & Plan:  1) Low-Risk Pregnancy G2P1001 at 1030w4dth an Estimated Date of Delivery: 10/27/21   2) Initial OB visit - Welcomed to practice and introduced self to patient in addition to discussing other advanced practice providers that she may be seeing at this practice - Congratulated patient - Anticipatory guidance on upcoming appointments - Educated on [place] pregnancy and the integration of virtual appointments  - Educated on babyscripts app- patient reports she has not received email, encouraged to look in spam folder and to call office if she still has not received email - patient verbalizes understanding    3) Supervision of other normal pregnancy, antepartum - Obstetric Panel, Including HIV - Hepatitis C Antibody - Culture, OB Urine - Genetic Screening - Hemoglobin A1c - Urine cytology ancillary only([place]) - Protein / creatinine ratio, urine - Comp Met (CMET) - US KoreaM OB DETAIL +14 WK; Future - Offered AFP to be done between 16-22.6 wks -- patient agrees - Anticipatory guidance of AFP at [facility]  4) [redacted] weeks gestation of pregnancy - Obstetric Panel, Including HIV - Hepatitis C Antibody - Culture, OB Urine - Genetic  Screening - Hemoglobin A1c - Urine cytology ancillary only([place]) - Protein / creatinine ratio, urine - Comp Met (CMET) - [place] MFM OB DETAIL +14 WK; Future  5) Elevated blood pressure affecting pregnancy, antepartum - Possible cHTN, will monitor - Protein / creatinine ratio, urine - Comp Met (CMET) - [place] MFM OB DETAIL +14 WK; Future    Meds: No orders of the defined types were placed in this encounter.   Initial labs obtained Continue prenatal  vitamins Reviewed n/v relief measures and warning s/s to report Reviewed recommended weight gain based on pre-gravid BMI Encouraged well-balanced diet Genetic Screening discussed: ordered Cystic fibrosis, SMA, Fragile X screening discussed ordered The nature of [facility] with multiple MDs and other Advanced Practice Providers was explained to patient; also emphasized that residents, students are part of our team.  Discussed optimized OB schedule and video visits. Advised can have an in-office visit whenever she feels she needs to be seen.  Does have own BP cuff. Check BP weekly, let us know if >140/90. Advised to call during normal business hours and there is an after-hours nurse line available.    Follow-up: Return in about 6 weeks (around 05/16/2021) for Return OB visit - AFP.   Orders Placed This Encounter  Procedures   Culture, OB Urine   [place] MFM OB DETAIL +14 WK   Obstetric Panel, Including HIV   Hepatitis C Antibody   Genetic Screening   Hemoglobin A1c   Protein / creatinine ratio, urine   Comp Met (CMET)    Laury Deep MSN, CNM 04/04/2021

## 2021-04-05 LAB — PROTEIN / CREATININE RATIO, URINE
Creatinine, Urine: 368 mg/dL
Protein, Ur: 26.6 mg/dL
Protein/Creat Ratio: 72 mg/g creat (ref 0–200)

## 2021-04-05 LAB — OBSTETRIC PANEL, INCLUDING HIV
Antibody Screen: NEGATIVE
Basophils Absolute: 0 10*3/uL (ref 0.0–0.2)
Basos: 0 %
EOS (ABSOLUTE): 0 10*3/uL (ref 0.0–0.4)
Eos: 0 %
HIV Screen 4th Generation wRfx: NONREACTIVE
Hematocrit: 39.7 % (ref 34.0–46.6)
Hemoglobin: 13.6 g/dL (ref 11.1–15.9)
Hepatitis B Surface Ag: NEGATIVE
Immature Grans (Abs): 0 10*3/uL (ref 0.0–0.1)
Immature Granulocytes: 0 %
Lymphocytes Absolute: 1.8 10*3/uL (ref 0.7–3.1)
Lymphs: 27 %
MCH: 29.3 pg (ref 26.6–33.0)
MCHC: 34.3 g/dL (ref 31.5–35.7)
MCV: 86 fL (ref 79–97)
Monocytes Absolute: 0.5 10*3/uL (ref 0.1–0.9)
Monocytes: 8 %
Neutrophils Absolute: 4.4 10*3/uL (ref 1.4–7.0)
Neutrophils: 65 %
Platelets: 269 10*3/uL (ref 150–450)
RBC: 4.64 x10E6/uL (ref 3.77–5.28)
RDW: 13 % (ref 11.7–15.4)
RPR Ser Ql: NONREACTIVE
Rh Factor: POSITIVE
Rubella Antibodies, IGG: 5.14 index (ref >0.99)
WBC: 6.8 10*3/uL (ref 3.4–10.8)

## 2021-04-05 LAB — COMPREHENSIVE METABOLIC PANEL
ALT: 12 IU/L (ref 0–32)
AST: 16 IU/L (ref 0–40)
Albumin/Globulin Ratio: 1.6 (ref 1.2–2.2)
Albumin: 4.4 g/dL (ref 3.8–4.8)
Alkaline Phosphatase: 70 IU/L (ref 44–121)
BUN/Creatinine Ratio: 11 (ref 9–23)
BUN: 7 mg/dL (ref 6–20)
Bilirubin Total: 0.4 mg/dL (ref 0.0–1.2)
CO2: 22 mmol/L (ref 20–29)
Calcium: 9.7 mg/dL (ref 8.7–10.2)
Chloride: 101 mmol/L (ref 96–106)
Creatinine, Ser: 0.65 mg/dL (ref 0.57–1.00)
Globulin, Total: 2.7 g/dL (ref 1.5–4.5)
Glucose: 78 mg/dL (ref 65–99)
Potassium: 3.7 mmol/L (ref 3.5–5.2)
Sodium: 139 mmol/L (ref 134–144)
Total Protein: 7.1 g/dL (ref 6.0–8.5)
eGFR: 118 mL/min/{1.73_m2} (ref >59)

## 2021-04-05 LAB — HEMOGLOBIN A1C
Est. average glucose Bld gHb Est-mCnc: 103 mg/dL
Hgb A1c MFr Bld: 5.2 % (ref 4.8–5.6)

## 2021-04-05 LAB — HEPATITIS C ANTIBODY: Hep C Virus Ab: <0.1 s/co ratio (ref 0.0–0.9)

## 2021-04-06 LAB — CULTURE, OB URINE

## 2021-04-06 LAB — URINE CULTURE, OB REFLEX

## 2021-04-09 LAB — URINE CYTOLOGY ANCILLARY ONLY
Chlamydia: NEGATIVE
Comment: NEGATIVE
Comment: NEGATIVE
Comment: NORMAL
Neisseria Gonorrhea: NEGATIVE
Trichomonas: NEGATIVE

## 2021-05-17 ENCOUNTER — Ambulatory Visit (INDEPENDENT_AMBULATORY_CARE_PROVIDER_SITE_OTHER): Payer: Medicaid Other | Admitting: Certified Nurse Midwife

## 2021-05-17 ENCOUNTER — Other Ambulatory Visit: Payer: Self-pay

## 2021-05-17 VITALS — BP 118/78 | HR 90 | Temp 97.7°F | Wt 180.4 lb

## 2021-05-17 DIAGNOSIS — Z3480 Encounter for supervision of other normal pregnancy, unspecified trimester: Secondary | ICD-10-CM

## 2021-05-17 DIAGNOSIS — Z3A16 16 weeks gestation of pregnancy: Secondary | ICD-10-CM | POA: Diagnosis not present

## 2021-05-17 NOTE — Progress Notes (Signed)
   PRENATAL VISIT NOTE  Subjective:  Kathryn Mack is a 35 y.o. G2P1001 at [redacted]w[redacted]d being seen today for ongoing prenatal care.  She is currently monitored for the following issues for this low-risk pregnancy and has Supervision of other normal pregnancy, antepartum and Cellulitis of face on their problem list.  Patient reports no complaints.  Contractions: Not present. Vag. Bleeding: None.  Movement: Present. Denies leaking of fluid.   The following portions of the patient's history were reviewed and updated as appropriate: allergies, current medications, past family history, past medical history, past social history, past surgical history and problem list.   Objective:   Vitals:   05/17/21 1040  BP: 118/78  Pulse: 90  Temp: 97.7 F (36.5 C)  Weight: 180 lb 6.4 oz (81.8 kg)    Fetal Status: Fetal Heart Rate (bpm): 152   Movement: Present     General:  Alert, oriented and cooperative. Patient is in no acute distress.  Skin: Skin is warm and dry. No rash noted.   Cardiovascular: Normal heart rate noted  Respiratory: Normal respiratory effort, no problems with respiration noted  Abdomen: Soft, gravid, appropriate for gestational age.  Pain/Pressure: Absent     Pelvic: Cervical exam deferred        Extremities: Normal range of motion.  Edema: None  Mental Status: Normal mood and affect. Normal behavior. Normal judgment and thought content.   Assessment and Plan:  Pregnancy: G2P1001 at [redacted]w[redacted]d 1. Supervision of other normal pregnancy, antepartum - Doing well, starting to feel fetal movement - Informed of increased carrier risk for SMA and gave partner kit for testing.  - AFP, Serum, Open Spina Bifida  2. [redacted] weeks gestation of pregnancy - Routine OB care   Preterm labor symptoms and general obstetric precautions including but not limited to vaginal bleeding, contractions, leaking of fluid and fetal movement were reviewed in detail with the patient. Please refer to After Visit  Summary for other counseling recommendations.   Return in about 4 weeks (around 06/14/2021) for IN-PERSON, LOB.  Future Appointments  Date Time Provider [place]  05/28/2021  2:30 PM [facility] NURSE [facility]  05/28/2021  2:45 PM [facility] US4 [facility]  06/12/2021  1:35 PM Laury Deep, CNM [facility] None  07/05/2021  9:00 AM Ngetich, Nelda Bucks, NP [facility] None  07/10/2021  1:35 PM Renee Harder, [place] None    Gabriel Carina, CNM

## 2021-05-17 NOTE — Patient Instructions (Signed)
You tested as having an increased risk of being a carrier for spinal muscular atrophy, a group of hereditary disorders that require both parents to be carriers and give one SMA gene each to the child. You can read more about SMA at [email or web address]

## 2021-05-24 LAB — AFP, SERUM, OPEN SPINA BIFIDA
AFP MoM: 1.86
AFP Value: 58 ng/mL
Gest. Age on Collection Date: 16 weeks
Maternal Age At EDD: 35.2 yr
OSBR Risk 1 IN: 1109
Test Results:: NEGATIVE
Weight: 180 [lb_av]

## 2021-05-28 ENCOUNTER — Ambulatory Visit: Payer: Medicaid Other

## 2021-06-06 ENCOUNTER — Ambulatory Visit: Payer: Medicaid Other

## 2021-06-06 ENCOUNTER — Other Ambulatory Visit: Payer: Medicaid Other

## 2021-06-12 ENCOUNTER — Encounter: Payer: Self-pay | Admitting: Obstetrics and Gynecology

## 2021-06-12 ENCOUNTER — Telehealth (INDEPENDENT_AMBULATORY_CARE_PROVIDER_SITE_OTHER): Payer: Medicaid Other | Admitting: Obstetrics and Gynecology

## 2021-06-12 VITALS — BP 108/76 | HR 85 | Wt 184.0 lb

## 2021-06-12 DIAGNOSIS — Z3A20 20 weeks gestation of pregnancy: Secondary | ICD-10-CM

## 2021-06-12 DIAGNOSIS — Z3480 Encounter for supervision of other normal pregnancy, unspecified trimester: Secondary | ICD-10-CM

## 2021-06-12 NOTE — Progress Notes (Signed)
   MY CHART VIDEO VIRTUAL OBSTETRICS VISIT ENCOUNTER NOTE  I connected with Kathryn Mack on 06/12/21 at  1:35 PM EST by My Chart video at home and verified that I am speaking with the correct person using two identifiers. Provider located at [employer] for [employer] at [place].   I discussed the limitations, risks, security and privacy concerns of performing an evaluation and management service by My Chart video and the availability of in person appointments. I also discussed with the patient that there may be a patient responsible charge related to this service. The patient expressed understanding and agreed to proceed.  I discussed the limitations of telemedicine and the availability of in person appointments.  Discussed there is a possibility of technology failure and discussed alternative modes of communication if that failure occurs.  Subjective:  Kathryn Mack is a 35 y.o. G2P1001 at [redacted]w[redacted]d being followed for ongoing prenatal care.  She is currently monitored for the following issues for this low-risk pregnancy and has Supervision of other normal pregnancy, antepartum and Cellulitis of face on their problem list.  Patient reports  occ cramping and pelvic pressure . Reports fetal movement. Denies any contractions, bleeding or leaking of fluid.   The following portions of the patient's history were reviewed and updated as appropriate: allergies, current medications, past family history, past medical history, past social history, past surgical history and problem list.   Objective:   General:  Alert, oriented and cooperative.   Mental Status: Normal mood and affect perceived. Normal judgment and thought content.  Rest of physical exam deferred due to type of encounter  BP 108/76   Pulse 85   Wt 184 lb (83.5 kg)   LMP 01/20/2021   BMI 31.58 kg/m  **Done by patient's own at home BP cuff and scale  Assessment and Plan:  Pregnancy: G2P1001 at [redacted]w[redacted]d  1. Supervision of  other normal pregnancy, antepartum - Reviewed normal AFP results - Reviewed husband's Horizon results >>carrier for alpha thalassemia; not carrier for SMA like patient - Anatomy U/S on 06/17/2021  2. [redacted] weeks gestation of pregnancy   Preterm labor symptoms and general obstetric precautions including but not limited to vaginal bleeding, contractions, leaking of fluid and fetal movement were reviewed in detail with the patient.  I discussed the assessment and treatment plan with the patient. The patient was provided an opportunity to ask questions and all were answered. The patient agreed with the plan and demonstrated an understanding of the instructions. The patient was advised to call back or seek an in-person office evaluation/go to MAU at [facility] for any urgent or concerning symptoms. Please refer to After Visit Summary for other counseling recommendations.   I provided 5 minutes of non-face-to-face time during this encounter. There was 5 minutes of chart review time spent prior to this encounter. Total time spent = 10 minutes.  Return in about 4 weeks (around 07/10/2021) for Return OB - My Chart video.  Future Appointments  Date Time Provider Department Center  06/17/2021 12:30 PM [facility] NURSE [facility]  06/17/2021 12:45 PM [facility] US6 [facility]  07/05/2021  9:00 AM Ngetich, Donalee Citrin, NP [facility] None  07/10/2021  1:35 PM Brand Males, CNM [facility] None    [name], CNM Center for [employer], [facility]

## 2021-06-14 ENCOUNTER — Encounter: Payer: Self-pay | Admitting: Certified Nurse Midwife

## 2021-06-17 ENCOUNTER — Ambulatory Visit (HOSPITAL_BASED_OUTPATIENT_CLINIC_OR_DEPARTMENT_OTHER): Payer: Medicaid Other

## 2021-06-17 ENCOUNTER — Other Ambulatory Visit: Payer: Self-pay

## 2021-06-17 ENCOUNTER — Ambulatory Visit: Payer: Medicaid Other | Attending: Obstetrics and Gynecology | Admitting: *Deleted

## 2021-06-17 ENCOUNTER — Encounter: Payer: Self-pay | Admitting: *Deleted

## 2021-06-17 ENCOUNTER — Other Ambulatory Visit: Payer: Self-pay | Admitting: *Deleted

## 2021-06-17 VITALS — BP 103/65 | HR 81

## 2021-06-17 DIAGNOSIS — E669 Obesity, unspecified: Secondary | ICD-10-CM | POA: Diagnosis not present

## 2021-06-17 DIAGNOSIS — Z363 Encounter for antenatal screening for malformations: Secondary | ICD-10-CM | POA: Diagnosis not present

## 2021-06-17 DIAGNOSIS — Z6831 Body mass index (BMI) 31.0-31.9, adult: Secondary | ICD-10-CM

## 2021-06-17 DIAGNOSIS — Z3480 Encounter for supervision of other normal pregnancy, unspecified trimester: Secondary | ICD-10-CM

## 2021-06-17 DIAGNOSIS — O169 Unspecified maternal hypertension, unspecified trimester: Secondary | ICD-10-CM | POA: Diagnosis not present

## 2021-06-17 DIAGNOSIS — Z362 Encounter for other antenatal screening follow-up: Secondary | ICD-10-CM

## 2021-06-17 DIAGNOSIS — O161 Unspecified maternal hypertension, first trimester: Secondary | ICD-10-CM | POA: Diagnosis present

## 2021-06-17 DIAGNOSIS — Z3A10 10 weeks gestation of pregnancy: Secondary | ICD-10-CM | POA: Insufficient documentation

## 2021-06-17 DIAGNOSIS — O99211 Obesity complicating pregnancy, first trimester: Secondary | ICD-10-CM | POA: Diagnosis not present

## 2021-07-05 ENCOUNTER — Ambulatory Visit: Payer: BLUE CROSS/BLUE SHIELD | Admitting: Family

## 2021-07-10 ENCOUNTER — Telehealth (INDEPENDENT_AMBULATORY_CARE_PROVIDER_SITE_OTHER): Payer: Medicaid Other

## 2021-07-10 VITALS — BP 113/81 | HR 98 | Wt 181.8 lb

## 2021-07-10 DIAGNOSIS — Z3480 Encounter for supervision of other normal pregnancy, unspecified trimester: Secondary | ICD-10-CM

## 2021-07-10 DIAGNOSIS — Z3A28 28 weeks gestation of pregnancy: Secondary | ICD-10-CM

## 2021-07-10 NOTE — Progress Notes (Signed)
° °  OBSTETRICS PRENATAL VIRTUAL VISIT ENCOUNTER NOTE  Provider [facility]: [facility]   Patient location: Home  I connected with Kathryn Mack on 07/10/21 at  1:35 PM EST by MyChart Video Encounter and verified that I am speaking with the correct person using two identifiers. I discussed the limitations, risks, security and privacy concerns of performing an evaluation and management service virtually and the availability of in person appointments. I also discussed with the patient that there may be a patient responsible charge related to this service. The patient expressed understanding and agreed to proceed. Subjective:  Kathryn Mack is a 35 y.o. G2P1001 at [redacted]w[redacted]d being seen today for ongoing prenatal care.  She is currently monitored for the following issues for this low-risk pregnancy and has Supervision of other normal pregnancy, antepartum and Cellulitis of face on their problem list.  Patient reports fatigue. Baby moves a lot during night and toddler keeps here awake. Patient's mom and significant other supportive. Patient also having aversion to meat. Contractions: Not present. Vag. Bleeding: None.  Movement: Present. Denies any leaking of fluid.   The following portions of the patient's history were reviewed and updated as appropriate: allergies, current medications, past family history, past medical history, past social history, past surgical history and problem list.   Objective:   Vitals:   07/10/21 1445  BP: 113/81  Pulse: 98  Weight: 181 lb 12.8 oz (82.5 kg)    Fetal Status:     Movement: Present     General:  Alert, oriented and cooperative. Patient is in no acute distress.  Respiratory: Normal respiratory effort, no problems with respiration noted  Mental Status: Normal mood and affect. Normal behavior. Normal judgment and thought content.  Rest of physical exam deferred due to type of encounter  Imaging:   Assessment and Plan:   Pregnancy: G2P1001 at [redacted]w[redacted]d 1. Supervision of other normal pregnancy, antepartum - Routine OB. Doing well.  - Patient has GTT and 28 week labs scheduled for next week - Anticipatory guidance provided  2. [redacted] weeks gestation of pregnancy   Preterm labor symptoms and general obstetric precautions including but not limited to vaginal bleeding, contractions, leaking of fluid and fetal movement were reviewed in detail with the patient. I discussed the assessment and treatment plan with the patient. The patient was provided an opportunity to ask questions and all were answered. The patient agreed with the plan and demonstrated an understanding of the instructions. The patient was advised to call back or seek an in-person office evaluation/go to MAU at [facility] for any urgent or concerning symptoms. Please refer to After Visit Summary for other counseling recommendations.   I provided 15 minutes of face-to-face time during this encounter.  Return in about 4 weeks (around 08/07/2021).  Future Appointments  Date Time Provider Department Center  07/16/2021  8:30 AM [facility] None  07/30/2021  2:45 PM [facility] NURSE [facility]  07/30/2021  3:00 PM [facility] US1 [facility]  08/07/2021  1:15 PM Bernerd Limbo, CNM [facility] None  09/04/2021  1:35 PM Gerrit Heck, CNM [facility] None  09/20/2021 10:35 AM Judeth Horn, NP [facility] None    Brand Males, CNM Center for [employer], [facility]

## 2021-07-15 ENCOUNTER — Ambulatory Visit: Payer: Medicaid Other

## 2021-07-16 ENCOUNTER — Other Ambulatory Visit (INDEPENDENT_AMBULATORY_CARE_PROVIDER_SITE_OTHER): Payer: Medicaid Other

## 2021-07-16 ENCOUNTER — Other Ambulatory Visit: Payer: Self-pay

## 2021-07-16 DIAGNOSIS — Z3480 Encounter for supervision of other normal pregnancy, unspecified trimester: Secondary | ICD-10-CM | POA: Diagnosis not present

## 2021-07-16 DIAGNOSIS — Z3A29 29 weeks gestation of pregnancy: Secondary | ICD-10-CM | POA: Diagnosis not present

## 2021-07-16 NOTE — Progress Notes (Signed)
° °  Patient in clinic to complete 28 week labs.   Clovis Pu, RN

## 2021-07-17 LAB — GLUCOSE TOLERANCE, 2 HOURS W/ 1HR
Glucose, 1 hour: 120 mg/dL (ref 70–179)
Glucose, 2 hour: 87 mg/dL (ref 70–152)
Glucose, Fasting: 80 mg/dL (ref 70–91)

## 2021-07-17 LAB — CBC
Hematocrit: 37.1 % (ref 34.0–46.6)
Hemoglobin: 12.3 g/dL (ref 11.1–15.9)
MCH: 29.8 pg (ref 26.6–33.0)
MCHC: 33.2 g/dL (ref 31.5–35.7)
MCV: 90 fL (ref 79–97)
Platelets: 254 10*3/uL (ref 150–450)
RBC: 4.13 x10E6/uL (ref 3.77–5.28)
RDW: 13.2 % (ref 11.7–15.4)
WBC: 9.6 10*3/uL (ref 3.4–10.8)

## 2021-07-17 LAB — HIV ANTIBODY (ROUTINE TESTING W REFLEX): HIV Screen 4th Generation wRfx: NONREACTIVE

## 2021-07-17 LAB — RPR: RPR Ser Ql: NONREACTIVE

## 2021-07-28 NOTE — L&D Delivery Note (Signed)
Delivery Note ?Arrived in MAU in active labor after SROM at home at 0524   Cervix dilated 9cm in MAU.  Upon arrival to Labor and Delivery, cervix was complete at [phone].  Pushed twice to SVD. ? ?At 6:19 AM a viable and healthy female was delivered via Vaginal, Spontaneous (Presentation: Middle Occiput Anterior).  APGAR: 9, 9; weight  .   ?Placenta status: Spontaneous, Intact.  Cord: 3 vessels with the following complications: None.   ? ?Anesthesia: None ?Episiotomy: None ?Lacerations: 1st degree, very tiny (1cm) and shallow ?Suture Repair:  suturing not required ?Est. Blood Loss (mL): 100 ? ?Mom to postpartum.  Baby to Couplet care / Skin to Skin. ? ?Wynelle Bourgeois ?10/23/2021, 6:47 AM ? ? ? ?

## 2021-07-30 ENCOUNTER — Ambulatory Visit: Payer: Medicaid Other | Admitting: *Deleted

## 2021-07-30 ENCOUNTER — Ambulatory Visit: Payer: Medicaid Other | Attending: Obstetrics

## 2021-07-30 ENCOUNTER — Other Ambulatory Visit: Payer: Self-pay

## 2021-07-30 ENCOUNTER — Other Ambulatory Visit: Payer: Self-pay | Admitting: *Deleted

## 2021-07-30 VITALS — BP 115/72 | HR 91

## 2021-07-30 DIAGNOSIS — O09512 Supervision of elderly primigravida, second trimester: Secondary | ICD-10-CM

## 2021-07-30 DIAGNOSIS — O09893 Supervision of other high risk pregnancies, third trimester: Secondary | ICD-10-CM | POA: Diagnosis not present

## 2021-07-30 DIAGNOSIS — Z363 Encounter for antenatal screening for malformations: Secondary | ICD-10-CM | POA: Diagnosis not present

## 2021-07-30 DIAGNOSIS — O09523 Supervision of elderly multigravida, third trimester: Secondary | ICD-10-CM | POA: Insufficient documentation

## 2021-07-30 DIAGNOSIS — O99212 Obesity complicating pregnancy, second trimester: Secondary | ICD-10-CM

## 2021-07-30 DIAGNOSIS — O09522 Supervision of elderly multigravida, second trimester: Secondary | ICD-10-CM

## 2021-07-30 DIAGNOSIS — Z6831 Body mass index (BMI) 31.0-31.9, adult: Secondary | ICD-10-CM

## 2021-07-30 DIAGNOSIS — Z362 Encounter for other antenatal screening follow-up: Secondary | ICD-10-CM | POA: Diagnosis present

## 2021-07-30 DIAGNOSIS — Z148 Genetic carrier of other disease: Secondary | ICD-10-CM | POA: Insufficient documentation

## 2021-07-30 DIAGNOSIS — O99213 Obesity complicating pregnancy, third trimester: Secondary | ICD-10-CM | POA: Insufficient documentation

## 2021-07-30 DIAGNOSIS — E669 Obesity, unspecified: Secondary | ICD-10-CM | POA: Diagnosis not present

## 2021-07-30 DIAGNOSIS — O09892 Supervision of other high risk pregnancies, second trimester: Secondary | ICD-10-CM

## 2021-07-30 DIAGNOSIS — Z3A27 27 weeks gestation of pregnancy: Secondary | ICD-10-CM | POA: Diagnosis not present

## 2021-07-30 DIAGNOSIS — Z3480 Encounter for supervision of other normal pregnancy, unspecified trimester: Secondary | ICD-10-CM

## 2021-07-30 DIAGNOSIS — O283 Abnormal ultrasonic finding on antenatal screening of mother: Secondary | ICD-10-CM

## 2021-08-07 ENCOUNTER — Ambulatory Visit (INDEPENDENT_AMBULATORY_CARE_PROVIDER_SITE_OTHER): Payer: Medicaid Other | Admitting: Certified Nurse Midwife

## 2021-08-07 ENCOUNTER — Other Ambulatory Visit: Payer: Self-pay

## 2021-08-07 VITALS — BP 125/71 | HR 89 | Temp 98.1°F | Wt 187.0 lb

## 2021-08-07 DIAGNOSIS — Z3493 Encounter for supervision of normal pregnancy, unspecified, third trimester: Secondary | ICD-10-CM | POA: Diagnosis not present

## 2021-08-07 DIAGNOSIS — Z3A32 32 weeks gestation of pregnancy: Secondary | ICD-10-CM

## 2021-08-07 NOTE — Progress Notes (Signed)
° °  PRENATAL VISIT NOTE  Subjective:  Kathryn Mack is a 36 y.o. G2P1001 at [redacted]w[redacted]d being seen today for ongoing prenatal care.  She is currently monitored for the following issues for this low-risk pregnancy and has Supervision of other normal pregnancy, antepartum and Cellulitis of face on their problem list.  Patient reports no complaints.  Contractions: Not present. Vag. Bleeding: None.  Movement: Present. Denies leaking of fluid.   The following portions of the patient's history were reviewed and updated as appropriate: allergies, current medications, past family history, past medical history, past social history, past surgical history and problem list.   Objective:   Vitals:   08/07/21 1327  BP: 125/71  Pulse: 89  Temp: 98.1 F (36.7 C)  Weight: 187 lb (84.8 kg)    Fetal Status: Fetal Heart Rate (bpm): 148 Fundal Height: 30 cm Movement: Present  Presentation: Complete Breech  General:  Alert, oriented and cooperative. Patient is in no acute distress.  Skin: Skin is warm and dry. No rash noted.   Cardiovascular: Normal heart rate noted  Respiratory: Normal respiratory effort, no problems with respiration noted  Abdomen: Soft, gravid, appropriate for gestational age.  Pain/Pressure: Present     Pelvic: Cervical exam deferred        Extremities: Normal range of motion.  Edema: None  Mental Status: Normal mood and affect. Normal behavior. Normal judgment and thought content.   Assessment and Plan:  Pregnancy: G2P1001 at [redacted]w[redacted]d 1. Supervision of low-risk pregnancy, third trimester - Doing well, feeling regular and vigorous fetal movement   2. [redacted] weeks gestation of pregnancy - Routine OB care  - Baby Leopold's as breech, advised on stretches/positioning that will encourage vertex presentation  Preterm labor symptoms and general obstetric precautions including but not limited to vaginal bleeding, contractions, leaking of fluid and fetal movement were reviewed in detail with the  patient. Please refer to After Visit Summary for other counseling recommendations.   Return in about 2 weeks (around 08/21/2021) for IN-PERSON.  Future Appointments  Date Time Provider Department Center  08/22/2021  2:35 PM [name], CNM [facility] None  08/26/2021  2:30 PM [facility] NURSE [facility]  08/26/2021  2:45 PM [facility] US6 [facility]  09/04/2021  1:35 PM Gerrit Heck, CNM [facility] None  09/20/2021 10:35 AM Judeth Horn, NP [facility] None   Bernerd Limbo, CNM

## 2021-08-22 ENCOUNTER — Other Ambulatory Visit: Payer: Self-pay

## 2021-08-22 ENCOUNTER — Ambulatory Visit (INDEPENDENT_AMBULATORY_CARE_PROVIDER_SITE_OTHER): Payer: Medicaid Other | Admitting: Obstetrics and Gynecology

## 2021-08-22 VITALS — BP 111/72 | HR 88 | Temp 97.5°F | Wt 187.6 lb

## 2021-08-22 DIAGNOSIS — Z3480 Encounter for supervision of other normal pregnancy, unspecified trimester: Secondary | ICD-10-CM

## 2021-08-22 DIAGNOSIS — Z3A30 30 weeks gestation of pregnancy: Secondary | ICD-10-CM

## 2021-08-22 NOTE — Progress Notes (Signed)
° °  LOW-RISK PREGNANCY OFFICE VISIT Patient name: Kathryn Mack MRN [id number]  Date of birth: 03-13-1986 Chief Complaint:   Routine Prenatal Visit  History of Present Illness:   Kathryn Mack is a 36 y.o. G65P1001 female at [redacted]w[redacted]d with an Estimated Date of Delivery: 10/27/21 being seen today for ongoing management of a low-risk pregnancy.  Today she reports no complaints. Contractions: Not present. Vag. Bleeding: None.  Movement: Present. denies leaking of fluid. Review of Systems:   Pertinent items are noted in HPI Denies abnormal vaginal discharge w/ itching/odor/irritation, headaches, visual changes, shortness of breath, chest pain, abdominal pain, severe nausea/vomiting, or problems with urination or bowel movements unless otherwise stated above. Pertinent History Reviewed:  Reviewed past medical,surgical, social, obstetrical and family history.  Reviewed problem list, medications and allergies. Physical Assessment:   Vitals:   08/22/21 1448  BP: 111/72  Pulse: 88  Temp: (!) 97.5 F (36.4 C)  Weight: 187 lb 9.6 oz (85.1 kg)  Body mass index is 32.2 kg/m.        Physical Examination:   General appearance: Well appearing, and in no distress  Mental status: Alert, oriented to person, place, and time  Skin: Warm & dry  Cardiovascular: Normal heart rate noted  Respiratory: Normal respiratory effort, no distress  Abdomen: Soft, gravid, nontender  Pelvic: Cervical exam deferred         Extremities: Edema: None  Fetal Status: Fetal Heart Rate (bpm): 141 Fundal Height: 31 cm Movement: Present    No results found for this or any previous visit (from the past 24 hour(s)).  Assessment & Plan:  1) Low-risk pregnancy G2P1001 at [redacted]w[redacted]d with an Estimated Date of Delivery: 10/27/21   2) Supervision of other normal pregnancy, antepartum - Discussed change in dating by US>>30.4 wks instead of 34.4 wks today  3) [redacted] weeks gestation of pregnancy    Meds: No orders of the defined types  were placed in this encounter.  Labs/procedures today: none  Plan:  Continue routine obstetrical care   Reviewed: Preterm labor symptoms and general obstetric precautions including but not limited to vaginal bleeding, contractions, leaking of fluid and fetal movement were reviewed in detail with the patient.  All questions were answered. Has home bp cuff. Check bp weekly, let us know if >140/90.   Follow-up: No follow-ups on file.  No orders of the defined types were placed in this encounter.  [name] MSN, CNM 08/22/2021 3:27 PM

## 2021-08-26 ENCOUNTER — Other Ambulatory Visit: Payer: Self-pay

## 2021-08-26 ENCOUNTER — Ambulatory Visit: Payer: Medicaid Other | Attending: Obstetrics

## 2021-08-26 ENCOUNTER — Ambulatory Visit: Payer: Medicaid Other | Admitting: *Deleted

## 2021-08-26 ENCOUNTER — Encounter: Payer: Self-pay | Admitting: *Deleted

## 2021-08-26 VITALS — BP 104/72 | HR 93

## 2021-08-26 DIAGNOSIS — O09513 Supervision of elderly primigravida, third trimester: Secondary | ICD-10-CM

## 2021-08-26 DIAGNOSIS — O09512 Supervision of elderly primigravida, second trimester: Secondary | ICD-10-CM

## 2021-08-26 DIAGNOSIS — O09523 Supervision of elderly multigravida, third trimester: Secondary | ICD-10-CM | POA: Insufficient documentation

## 2021-08-26 DIAGNOSIS — O99213 Obesity complicating pregnancy, third trimester: Secondary | ICD-10-CM | POA: Diagnosis not present

## 2021-08-26 DIAGNOSIS — E668 Other obesity: Secondary | ICD-10-CM | POA: Diagnosis not present

## 2021-08-26 DIAGNOSIS — Z362 Encounter for other antenatal screening follow-up: Secondary | ICD-10-CM | POA: Insufficient documentation

## 2021-08-26 DIAGNOSIS — O283 Abnormal ultrasonic finding on antenatal screening of mother: Secondary | ICD-10-CM

## 2021-08-26 DIAGNOSIS — Z3480 Encounter for supervision of other normal pregnancy, unspecified trimester: Secondary | ICD-10-CM | POA: Insufficient documentation

## 2021-08-26 DIAGNOSIS — O09892 Supervision of other high risk pregnancies, second trimester: Secondary | ICD-10-CM

## 2021-08-26 DIAGNOSIS — O09893 Supervision of other high risk pregnancies, third trimester: Secondary | ICD-10-CM | POA: Diagnosis not present

## 2021-08-26 DIAGNOSIS — Z3A31 31 weeks gestation of pregnancy: Secondary | ICD-10-CM | POA: Diagnosis not present

## 2021-08-26 DIAGNOSIS — D563 Thalassemia minor: Secondary | ICD-10-CM | POA: Diagnosis not present

## 2021-08-26 DIAGNOSIS — O99212 Obesity complicating pregnancy, second trimester: Secondary | ICD-10-CM

## 2021-08-31 ENCOUNTER — Encounter: Payer: Self-pay | Admitting: Obstetrics and Gynecology

## 2021-09-04 ENCOUNTER — Ambulatory Visit (INDEPENDENT_AMBULATORY_CARE_PROVIDER_SITE_OTHER): Payer: Medicaid Other

## 2021-09-04 ENCOUNTER — Other Ambulatory Visit: Payer: Self-pay

## 2021-09-04 VITALS — BP 113/61 | HR 110 | Temp 98.1°F | Wt 185.2 lb

## 2021-09-04 DIAGNOSIS — Z3480 Encounter for supervision of other normal pregnancy, unspecified trimester: Secondary | ICD-10-CM

## 2021-09-04 DIAGNOSIS — Z3A32 32 weeks gestation of pregnancy: Secondary | ICD-10-CM

## 2021-09-04 NOTE — Progress Notes (Addendum)
° °  LOW-RISK PREGNANCY OFFICE VISIT  Patient name: Kathryn Mack MRN [id number]  Date of birth: 1986-05-23 Chief Complaint:   Routine Prenatal Visit  Subjective:   Kathryn Mack is a 36 y.o. G51P1001 female at [redacted]w[redacted]d with an Estimated Date of Delivery: 10/27/21 being seen today for ongoing management of a low-risk pregnancy aeb has Supervision of other normal pregnancy, antepartum on their problem list.  Patient presents today with no complaints.  Patient questions if she can get a pregnancy massage. Patient endorses fetal movement. Patient denies abdominal cramping or contractions.  Patient denies vaginal concerns including abnormal discharge, leaking of fluid, and bleeding.  She reports nausea all week, but able to eat despite feeling picky.  Contractions: Not present. Vag. Bleeding: None.  Movement: Present.  Reviewed past medical,surgical, social, obstetrical and family history as well as problem list, medications and allergies.  Objective   Vitals:   09/04/21 1401  BP: 113/61  Pulse: (!) 110  Temp: 98.1 F (36.7 C)  Weight: 185 lb 3.2 oz (84 kg)  Body mass index is 31.79 kg/m.  Total Weight Gain:-1 lb 12.8 oz (-0.816 kg)         Physical Examination:   General appearance: Well appearing, and in no distress  Mental status: Alert, oriented to person, place, and time  Skin: Warm & dry  Cardiovascular: Normal heart rate noted  Respiratory: Normal respiratory effort, no distress  Abdomen: Soft, gravid, nontender, AGA with Fundal Height: 32 cm  Pelvic: Cervical exam deferred           Extremities: Edema: None  Fetal Status: Fetal Heart Rate (bpm): 142  Movement: Present   No results found for this or any previous visit (from the past 24 hour(s)).  Assessment & Plan:  Low-risk pregnancy of a 35 y.o., G2P1001 at [redacted]w[redacted]d with an Estimated Date of Delivery: 10/27/21   1. Supervision of other normal pregnancy, antepartum -Anticipatory guidance for upcoming appts. -Patient to  schedule next appt in 2 weeks for an in-person visit. -Care transferred to [facility] office for remainder of pregnancy.   2. [redacted] weeks gestation of pregnancy -Doing well. -BP normotensive today. -Informed okay for pregnancy massage.  Encouraged avoidance of deep tissue and adequate hydration before and after.       Meds: No orders of the defined types were placed in this encounter.  Labs/procedures today:  Lab Orders  No laboratory test(s) ordered today     Reviewed: Term labor symptoms and general obstetric precautions including but not limited to vaginal bleeding, contractions, leaking of fluid and fetal movement were reviewed in detail with the patient.  All questions were answered.  Follow-up: Return in about 2 weeks (around 09/18/2021) for LROB.  No orders of the defined types were placed in this encounter.  Cherre Robins MSN, CNM 09/04/2021

## 2021-09-16 ENCOUNTER — Encounter: Payer: Medicaid Other | Admitting: Family Medicine

## 2021-09-17 ENCOUNTER — Other Ambulatory Visit: Payer: Self-pay

## 2021-09-17 ENCOUNTER — Ambulatory Visit (INDEPENDENT_AMBULATORY_CARE_PROVIDER_SITE_OTHER): Payer: Medicaid Other | Admitting: Obstetrics & Gynecology

## 2021-09-17 ENCOUNTER — Encounter: Payer: Self-pay | Admitting: Obstetrics & Gynecology

## 2021-09-17 VITALS — BP 121/77 | HR 86 | Wt 187.0 lb

## 2021-09-17 DIAGNOSIS — Z3A34 34 weeks gestation of pregnancy: Secondary | ICD-10-CM

## 2021-09-17 DIAGNOSIS — Z3480 Encounter for supervision of other normal pregnancy, unspecified trimester: Secondary | ICD-10-CM

## 2021-09-17 DIAGNOSIS — Z23 Encounter for immunization: Secondary | ICD-10-CM | POA: Diagnosis not present

## 2021-09-17 NOTE — Patient Instructions (Signed)
Return to office for any scheduled appointments. Call the office or go to the MA[facility] at [facility] [place] if:  You begin to have strong, frequent contractions  Your water breaks.  Sometimes it is a big gush of fluid, sometimes it is just a trickle that keeps getting your panties wet or running down your legs  You have vaginal bleeding.  It is normal to have a small amount of spotting if your cervix was checked.   You do not feel your baby moving like normal.  If you do not, get something to eat and drink and lay down and focus on feeling your baby move.   If your baby is still not moving like normal, you should call the office or go to MAU.  Any other obstetric concerns.   

## 2021-09-17 NOTE — Progress Notes (Signed)
° °  PRENATAL VISIT NOTE  Subjective:  Kathryn Mack is a 36 y.o. G2P1001 at [redacted]w[redacted]d being seen today for ongoing prenatal care.  She is currently monitored for the following issues for this low-risk pregnancy and has Supervision of other normal pregnancy, antepartum on their problem list.  Patient reports no complaints.  Contractions: Not present.  .  Movement: Present. Denies leaking of fluid.   The following portions of the patient's history were reviewed and updated as appropriate: allergies, current medications, past family history, past medical history, past social history, past surgical history and problem list.   Objective:   Vitals:   09/17/21 1140  BP: 121/77  Pulse: 86  Weight: 187 lb (84.8 kg)    Fetal Status: Fetal Heart Rate (bpm): 142 Fundal Height: 34 cm Movement: Present     General:  Alert, oriented and cooperative. Patient is in no acute distress.  Skin: Skin is warm and dry. No rash noted.   Cardiovascular: Normal heart rate noted  Respiratory: Normal respiratory effort, no problems with respiration noted  Abdomen: Soft, gravid, appropriate for gestational age.  Pain/Pressure: Absent     Pelvic: Cervical exam deferred        Extremities: Normal range of motion.  Edema: None  Mental Status: Normal mood and affect. Normal behavior. Normal judgment and thought content.   Assessment and Plan:  Pregnancy: G2P1001 at [redacted]w[redacted]d 1. Need for Tdap vaccination - Tdap vaccine greater than or equal to 7yo IM  2. [redacted] weeks gestation of pregnancy 3. Supervision of other normal pregnancy, antepartum Pelvic cultures next visit. Preterm labor symptoms and general obstetric precautions including but not limited to vaginal bleeding, contractions, leaking of fluid and fetal movement were reviewed in detail with the patient. Please refer to After Visit Summary for other counseling recommendations.   Return in about 2 weeks (around 10/01/2021) for Pelvic cultures, OFFICE OB VISIT (MD or  APP).  No future appointments.  Verita Schneiders, MD

## 2021-09-17 NOTE — Progress Notes (Signed)
Tdap vaccine administered into left deltoid without any complications. Patient had no questions or concerns today.   Dawayne Patricia, CMA   09/17/21

## 2021-09-20 ENCOUNTER — Encounter: Payer: Medicaid Other | Admitting: Certified Nurse Midwife

## 2021-09-30 DIAGNOSIS — M9905 Segmental and somatic dysfunction of pelvic region: Secondary | ICD-10-CM | POA: Diagnosis not present

## 2021-09-30 DIAGNOSIS — M9904 Segmental and somatic dysfunction of sacral region: Secondary | ICD-10-CM | POA: Diagnosis not present

## 2021-09-30 DIAGNOSIS — M5386 Other specified dorsopathies, lumbar region: Secondary | ICD-10-CM | POA: Diagnosis not present

## 2021-09-30 DIAGNOSIS — M9903 Segmental and somatic dysfunction of lumbar region: Secondary | ICD-10-CM | POA: Diagnosis not present

## 2021-10-01 ENCOUNTER — Other Ambulatory Visit: Payer: Self-pay

## 2021-10-01 ENCOUNTER — Ambulatory Visit (INDEPENDENT_AMBULATORY_CARE_PROVIDER_SITE_OTHER): Payer: Medicaid Other | Admitting: Obstetrics & Gynecology

## 2021-10-01 ENCOUNTER — Other Ambulatory Visit (HOSPITAL_COMMUNITY)
Admission: RE | Admit: 2021-10-01 | Discharge: 2021-10-01 | Disposition: A | Discharge status: Home / Self Care | Payer: Medicaid Other | Source: Ambulatory Visit | Attending: Obstetrics & Gynecology | Admitting: Obstetrics & Gynecology

## 2021-10-01 VITALS — BP 113/79 | HR 85 | Wt 188.0 lb

## 2021-10-01 DIAGNOSIS — Z3480 Encounter for supervision of other normal pregnancy, unspecified trimester: Secondary | ICD-10-CM | POA: Diagnosis not present

## 2021-10-01 DIAGNOSIS — N750 Cyst of Bartholin's gland: Secondary | ICD-10-CM

## 2021-10-01 DIAGNOSIS — Z3A36 36 weeks gestation of pregnancy: Secondary | ICD-10-CM

## 2021-10-01 DIAGNOSIS — Z3483 Encounter for supervision of other normal pregnancy, third trimester: Secondary | ICD-10-CM

## 2021-10-01 NOTE — Progress Notes (Addendum)
° °  PRENATAL VISIT NOTE  Subjective:  Kathryn Mack is a 36 y.o. G2P1001 at [redacted]w[redacted]d being seen today for ongoing prenatal care.  She is currently monitored for the following issues for this low-risk pregnancy and has Supervision of other normal pregnancy, antepartum on their problem list.  Patient reports no complaints.  Contractions: Not present. Vag. Bleeding: None.  Movement: Present. Denies leaking of fluid.   The following portions of the patient's history were reviewed and updated as appropriate: allergies, current medications, past family history, past medical history, past social history, past surgical history and problem list.   Objective:   Vitals:   10/01/21 1125  BP: 113/79  Pulse: 85  Weight: 188 lb (85.3 kg)    Fetal Status: Fetal Heart Rate (bpm): 144 Fundal Height: 36 cm Movement: Present  Presentation: Vertex  General:  Alert, oriented and cooperative. Patient is in no acute distress.  Skin: Skin is warm and dry. No rash noted.   Cardiovascular: Normal heart rate noted  Respiratory: Normal respiratory effort, no problems with respiration noted  Abdomen: Soft, gravid, appropriate for gestational age.  Pain/Pressure: Absent     Pelvic: Cervical exam performed in the presence of a chaperone Dilation: 1 Effacement (%): 50 Station: -3 2 cm enlarged right Bartholin's gland cyst noted, no erythema, no induration, no tenderness.  Extremities: Normal range of motion.  Edema: None  Mental Status: Normal mood and affect. Normal behavior. Normal judgment and thought content.   Assessment and Plan:  Pregnancy: G2P1001 at [redacted]w[redacted]d 1. Cyst of right Bartholin's gland No signs of infection, no need for emergent I&D.  Told to do warm compresses for now. Cautioned about signs of infection.  2. [redacted] weeks gestation of pregnancy 3. Supervision of other normal pregnancy, antepartum Pelvic cultures done today, will follow up results and manage accordingly. - GC/Chlamydia probe amp ([facility])not at [facility] - Strep Gp B NAA Preterm labor symptoms and general obstetric precautions including but not limited to vaginal bleeding, contractions, leaking of fluid and fetal movement were reviewed in detail with the patient. Please refer to After Visit Summary for other counseling recommendations.   Return in about 1 week (around 10/08/2021) for OFFICE OB VISIT (MD only).  Future Appointments  Date Time Provider Department Center  10/08/2021 11:15 AM Alleene Stoy, Jethro Bastos, MD [facility]  10/15/2021 11:35 AM Shondell Poulson, Jethro Bastos, MD [facility]  10/22/2021 11:15 AM [address] Sch[address]st, Jethro Bastos, MD [facility]  10/29/2021 11:35 AM Sherrie Marsan, Jethro Bastos, MD [facility]    Jaynie Collins, MD

## 2021-10-01 NOTE — Patient Instructions (Signed)
Return to office for any scheduled appointments. Call the office or go to the MAU at [facility] [place] if:  You begin to have strong, frequent contractions  Your water breaks.  Sometimes it is a big gush of fluid, sometimes it is just a trickle that keeps getting your panties wet or running down your legs  You have vaginal bleeding.  It is normal to have a small amount of spotting if your cervix was checked.   You do not feel your baby moving like normal.  If you do not, get something to eat and drink and lay down and focus on feeling your baby move.   If your baby is still not moving like normal, you should call the office or go to MAU.  Any other obstetric concerns.   

## 2021-10-02 LAB — GC/CHLAMYDIA PROBE AMP (~~LOC~~) NOT AT ARMC
Chlamydia: NEGATIVE
Comment: NEGATIVE
Comment: NORMAL
Neisseria Gonorrhea: NEGATIVE

## 2021-10-03 DIAGNOSIS — M5386 Other specified dorsopathies, lumbar region: Secondary | ICD-10-CM | POA: Diagnosis not present

## 2021-10-03 DIAGNOSIS — M9905 Segmental and somatic dysfunction of pelvic region: Secondary | ICD-10-CM | POA: Diagnosis not present

## 2021-10-03 DIAGNOSIS — M9904 Segmental and somatic dysfunction of sacral region: Secondary | ICD-10-CM | POA: Diagnosis not present

## 2021-10-03 DIAGNOSIS — M9903 Segmental and somatic dysfunction of lumbar region: Secondary | ICD-10-CM | POA: Diagnosis not present

## 2021-10-03 LAB — STREP GP B NAA: Strep Gp B NAA: NEGATIVE

## 2021-10-07 DIAGNOSIS — M9904 Segmental and somatic dysfunction of sacral region: Secondary | ICD-10-CM | POA: Diagnosis not present

## 2021-10-07 DIAGNOSIS — M9903 Segmental and somatic dysfunction of lumbar region: Secondary | ICD-10-CM | POA: Diagnosis not present

## 2021-10-07 DIAGNOSIS — M9905 Segmental and somatic dysfunction of pelvic region: Secondary | ICD-10-CM | POA: Diagnosis not present

## 2021-10-07 DIAGNOSIS — M5386 Other specified dorsopathies, lumbar region: Secondary | ICD-10-CM | POA: Diagnosis not present

## 2021-10-08 ENCOUNTER — Encounter: Payer: Medicaid Other | Admitting: Obstetrics & Gynecology

## 2021-10-08 ENCOUNTER — Encounter: Payer: Self-pay | Admitting: Obstetrics & Gynecology

## 2021-10-08 ENCOUNTER — Telehealth (INDEPENDENT_AMBULATORY_CARE_PROVIDER_SITE_OTHER): Payer: Medicaid Other | Admitting: Obstetrics & Gynecology

## 2021-10-08 VITALS — BP 122/84 | HR 86 | Wt 188.6 lb

## 2021-10-08 DIAGNOSIS — Z3483 Encounter for supervision of other normal pregnancy, third trimester: Secondary | ICD-10-CM

## 2021-10-08 DIAGNOSIS — Z3480 Encounter for supervision of other normal pregnancy, unspecified trimester: Secondary | ICD-10-CM

## 2021-10-08 DIAGNOSIS — Z3A37 37 weeks gestation of pregnancy: Secondary | ICD-10-CM

## 2021-10-08 DIAGNOSIS — N750 Cyst of Bartholin's gland: Secondary | ICD-10-CM

## 2021-10-08 NOTE — Progress Notes (Signed)
? ?OBSTETRICS PRENATAL VIRTUAL VISIT ENCOUNTER NOTE ? ?Provider location: Center for [employer] at [facility]  ? ?Patient location: Home ? ?I connected with Kathryn Mack on 10/08/21 at 11:15 AM EDT by MyChart Video Encounter and verified that I am speaking with the correct person using two identifiers. I discussed the limitations, risks, security and privacy concerns of performing an evaluation and management service virtually and the availability of in person appointments. I also discussed with the patient that there may be a patient responsible charge related to this service. The patient expressed understanding and agreed to proceed. ?Subjective:  ?Kathryn Mack is a 36 y.o. G2P1001 at [redacted]w[redacted]d being seen today for ongoing prenatal care.  She is currently monitored for the following issues for this low-risk pregnancy and has Supervision of other normal pregnancy, antepartum on their problem list. ? ?Patient reports no complaints. R Bartholin's cyst gland diagnosed last week is stable, she is doing warm compresses as instructed. No signs of infection.  Contractions: Not present. Vag. Bleeding: None.  Movement: Present. Denies any leaking of fluid.  ? ?The following portions of the patient's history were reviewed and updated as appropriate: allergies, current medications, past family history, past medical history, past social history, past surgical history and problem list.  ? ?Objective:  ? ?Vitals:  ? 10/08/21 1135  ?BP: 122/84  ?Pulse: 86  ?Weight: 188 lb 9.6 oz (85.5 kg)  ? ? ?Fetal Status:     Movement: Present    ? ?General:  Alert, oriented and cooperative. Patient is in no acute distress.  ?Respiratory: Normal respiratory effort, no problems with respiration noted  ?Mental Status: Normal mood and affect. Normal behavior. Normal judgment and thought content.  ?Rest of physical exam deferred due to type of encounter ? ?Results for orders placed or performed in visit on 10/01/21 (from the past  168 hour(s))  ?GC/Chlamydia probe amp ([place])not at [facility]  ? Collection Time: 10/01/21 11:45 AM  ?Result Value Ref Range  ? Chlamydia Negative   ? Neisseria Gonorrhea Negative   ? Comment Normal Reference Ranger Chlamydia - Negative   ? Comment    ?  Normal Reference Range Neisseria Gonorrhea - Negative  ?Strep Gp B NAA  ? Collection Time: 10/01/21  2:11 PM  ? Specimen: Vaginal/Rectal; Genital  ? VR  ?Result Value Ref Range  ? Strep Gp B NAA Negative Negative  ? ?Assessment and Plan:  ?Pregnancy: G2P1001 at [redacted]w[redacted]d ?1. Bartholin gland cyst ?Continue warm compresses as instructed. She was told to come for I&D for worsening symptoms or signs of infection. ?She is interested in doing an I&D during labor if needed as she will have an epidural. ? ?2. [redacted] weeks gestation of pregnancy ?3. Supervision of other normal pregnancy, antepartum ?Negative pelvic cultures, discussed with patient. ?No other concerns.  Labor symptoms and general obstetric precautions including but not limited to vaginal bleeding, contractions, leaking of fluid and fetal movement were reviewed in detail with the patient. ?I discussed the assessment and treatment plan with the patient. The patient was provided an opportunity to ask questions and all were answered. The patient agreed with the plan and demonstrated an understanding of the instructions. The patient was advised to call back or seek an in-person office evaluation/go to MAU at [facility] for any urgent or concerning symptoms. ?Please refer to After Visit Summary for other counseling recommendations.  ? ?I provided 10 minutes of face-to-face time during this encounter. ? ?Return in about 1  week (around 10/15/2021) for Virtual OB Visits as scheduled. ? ?Future Appointments  ?Date Time Provider Department Center  ?10/15/2021 11:35 AM Aliena Ghrist, Jethro Bastos, MD [facility]  ?10/22/2021 11:15 AM Agness Sibrian, Jethro Bastos, MD [facility]  ?10/29/2021 11:35 AM Sandrika Schwinn, Jethro Bastos, MD [facility]  ? ? ?Jaynie Collins, MD ?Center for [facility], [facility] ? ?

## 2021-10-10 DIAGNOSIS — M9904 Segmental and somatic dysfunction of sacral region: Secondary | ICD-10-CM | POA: Diagnosis not present

## 2021-10-10 DIAGNOSIS — M9903 Segmental and somatic dysfunction of lumbar region: Secondary | ICD-10-CM | POA: Diagnosis not present

## 2021-10-10 DIAGNOSIS — M5386 Other specified dorsopathies, lumbar region: Secondary | ICD-10-CM | POA: Diagnosis not present

## 2021-10-10 DIAGNOSIS — M9905 Segmental and somatic dysfunction of pelvic region: Secondary | ICD-10-CM | POA: Diagnosis not present

## 2021-10-14 DIAGNOSIS — M5386 Other specified dorsopathies, lumbar region: Secondary | ICD-10-CM | POA: Diagnosis not present

## 2021-10-14 DIAGNOSIS — M9905 Segmental and somatic dysfunction of pelvic region: Secondary | ICD-10-CM | POA: Diagnosis not present

## 2021-10-14 DIAGNOSIS — M9903 Segmental and somatic dysfunction of lumbar region: Secondary | ICD-10-CM | POA: Diagnosis not present

## 2021-10-14 DIAGNOSIS — M9904 Segmental and somatic dysfunction of sacral region: Secondary | ICD-10-CM | POA: Diagnosis not present

## 2021-10-15 ENCOUNTER — Other Ambulatory Visit: Payer: Self-pay

## 2021-10-15 ENCOUNTER — Encounter: Payer: Self-pay | Admitting: Obstetrics & Gynecology

## 2021-10-15 ENCOUNTER — Telehealth (INDEPENDENT_AMBULATORY_CARE_PROVIDER_SITE_OTHER): Payer: Medicaid Other | Admitting: Obstetrics & Gynecology

## 2021-10-15 VITALS — BP 109/87 | HR 105 | Wt 189.2 lb

## 2021-10-15 DIAGNOSIS — Z3480 Encounter for supervision of other normal pregnancy, unspecified trimester: Secondary | ICD-10-CM

## 2021-10-15 DIAGNOSIS — Z3483 Encounter for supervision of other normal pregnancy, third trimester: Secondary | ICD-10-CM

## 2021-10-15 DIAGNOSIS — Z3A38 38 weeks gestation of pregnancy: Secondary | ICD-10-CM

## 2021-10-15 DIAGNOSIS — N750 Cyst of Bartholin's gland: Secondary | ICD-10-CM

## 2021-10-15 NOTE — Progress Notes (Signed)
? ?  OBSTETRICS PRENATAL VIRTUAL VISIT ENCOUNTER NOTE ? ?Provider location: Center for [employer] at [facility]  ? ?Patient location: Home ? ?I connected with Kathryn Mack on 10/15/21 at 11:35 AM EDT by MyChart Video Encounter and verified that I am speaking with the correct person using two identifiers. I discussed the limitations, risks, security and privacy concerns of performing an evaluation and management service virtually and the availability of in person appointments. I also discussed with the patient that there may be a patient responsible charge related to this service. The patient expressed understanding and agreed to proceed. ?Subjective:  ?Kathryn Mack is a 36 y.o. G2P1001 at [redacted]w[redacted]d being seen today for ongoing prenatal care.  She is currently monitored for the following issues for this low-risk pregnancy and has Supervision of other normal pregnancy, antepartum on their problem list. ? ?Patient reports no complaints.  Contractions: Not present. Vag. Bleeding: None.  Movement: Present. Denies any leaking of fluid.  ? ?The following portions of the patient's history were reviewed and updated as appropriate: allergies, current medications, past family history, past medical history, past social history, past surgical history and problem list.  ? ?Objective:  ? ?Vitals:  ? 10/15/21 1136  ?BP: 109/87  ?Pulse: (!) 105  ?Weight: 189 lb 3.2 oz (85.8 kg)  ? ? ?Fetal Status:     Movement: Present    ? ?General:  Alert, oriented and cooperative. Patient is in no acute distress.  ?Respiratory: Normal respiratory effort, no problems with respiration noted  ?Mental Status: Normal mood and affect. Normal behavior. Normal judgment and thought content.  ?Rest of physical exam deferred due to type of encounter ? ? ?Assessment and Plan:  ?Pregnancy: G2P1001 at [redacted]w[redacted]d ?1. Bartholin gland cyst ?No acute concerns. Continue warm compresses as instructed. She was told to come for I&D for worsening symptoms or  signs of infection. She is interested in doing an I&D during labor if needed as she will have an epidural. ?  ?2. [redacted] weeks gestation of pregnancy ?3. Supervision of other normal pregnancy, antepartum ?Term labor symptoms and general obstetric precautions including but not limited to vaginal bleeding, contractions, leaking of fluid and fetal movement were reviewed in detail with the patient. ?I discussed the assessment and treatment plan with the patient. The patient was provided an opportunity to ask questions and all were answered. The patient agreed with the plan and demonstrated an understanding of the instructions. The patient was advised to call back or seek an in-person office evaluation/go to MAU at [facility] for any urgent or concerning symptoms. ?Please refer to After Visit Summary for other counseling recommendations.  ? ?I provided 10 minutes of face-to-face time during this encounter. ? ?Return in about 1 week (around 10/22/2021) for OB visits as scheduled. ? ?Future Appointments  ?Date Time Provider Department Center  ?10/22/2021 11:15 AM Tyren Dugar, Jethro Bastos, MD [facility]  ?10/29/2021 11:35 AM Alonzo Owczarzak, Jethro Bastos, MD [facility]  ? ? ?Jaynie Collins, MD ?Center for [facility], [facility] ? ?

## 2021-10-17 DIAGNOSIS — M9905 Segmental and somatic dysfunction of pelvic region: Secondary | ICD-10-CM | POA: Diagnosis not present

## 2021-10-17 DIAGNOSIS — M9903 Segmental and somatic dysfunction of lumbar region: Secondary | ICD-10-CM | POA: Diagnosis not present

## 2021-10-17 DIAGNOSIS — M5386 Other specified dorsopathies, lumbar region: Secondary | ICD-10-CM | POA: Diagnosis not present

## 2021-10-17 DIAGNOSIS — M9904 Segmental and somatic dysfunction of sacral region: Secondary | ICD-10-CM | POA: Diagnosis not present

## 2021-10-21 DIAGNOSIS — M9904 Segmental and somatic dysfunction of sacral region: Secondary | ICD-10-CM | POA: Diagnosis not present

## 2021-10-21 DIAGNOSIS — M9905 Segmental and somatic dysfunction of pelvic region: Secondary | ICD-10-CM | POA: Diagnosis not present

## 2021-10-21 DIAGNOSIS — M9903 Segmental and somatic dysfunction of lumbar region: Secondary | ICD-10-CM | POA: Diagnosis not present

## 2021-10-21 DIAGNOSIS — M5386 Other specified dorsopathies, lumbar region: Secondary | ICD-10-CM | POA: Diagnosis not present

## 2021-10-22 ENCOUNTER — Encounter: Payer: Self-pay | Admitting: Obstetrics & Gynecology

## 2021-10-22 ENCOUNTER — Telehealth (INDEPENDENT_AMBULATORY_CARE_PROVIDER_SITE_OTHER): Payer: Medicaid Other | Admitting: Obstetrics & Gynecology

## 2021-10-22 VITALS — BP 127/89 | HR 107 | Wt 187.4 lb

## 2021-10-22 DIAGNOSIS — Z3480 Encounter for supervision of other normal pregnancy, unspecified trimester: Secondary | ICD-10-CM

## 2021-10-22 DIAGNOSIS — Z3A39 39 weeks gestation of pregnancy: Secondary | ICD-10-CM

## 2021-10-22 NOTE — Progress Notes (Signed)
? ?OBSTETRICS PRENATAL VIRTUAL VISIT ENCOUNTER NOTE ? ?Provider location: Center for [employer] at [facility]  ? ?Patient location: Home ? ?I connected with Kathryn Mack on 10/22/21 at 11:15 AM EDT by MyChart Video Encounter and verified that I am speaking with the correct person using two identifiers. I discussed the limitations, risks, security and privacy concerns of performing an evaluation and management service virtually and the availability of in person appointments. I also discussed with the patient that there may be a patient responsible charge related to this service. The patient expressed understanding and agreed to proceed. ?Subjective:  ?Kathryn Mack is a 36 y.o. G2P1001 at [redacted]w[redacted]d being seen today for ongoing prenatal care.  She is currently monitored for the following issues for this low-risk pregnancy and has Supervision of other normal pregnancy, antepartum on their problem list. ? ?Patient reports no complaints.  Contractions: Not present. Vag. Bleeding: None.  Movement: Present. Denies any leaking of fluid.  ? ?The following portions of the patient's history were reviewed and updated as appropriate: allergies, current medications, past family history, past medical history, past social history, past surgical history and problem list.  ? ?Objective:  ? ?Vitals:  ? 10/22/21 1123  ?BP: 127/89  ?Pulse: (!) 107  ?Weight: 187 lb 6.4 oz (85 kg)  ? ? ?Fetal Status:     Movement: Present    ? ?General:  Alert, oriented and cooperative. Patient is in no acute distress.  ?Respiratory: Normal respiratory effort, no problems with respiration noted  ?Mental Status: Normal mood and affect. Normal behavior. Normal judgment and thought content.  ?Rest of physical exam deferred due to type of encounter ? ?Imaging: ?No results found. ? ?Assessment and Plan:  ?Pregnancy: G2P1001 at [redacted]w[redacted]d ?1. Supervision of other normal pregnancy, antepartum ?2. [redacted] weeks gestation of pregnancy ?Desires IOL at 40 weeks,  she was told she can go up to 41 weeks to maximize chances of spontaneous labor, also talked about need for post term BPP.  She still desired IOL at 40 weeks.  Risks and benefits of induction were reviewed, including failure of method, prolonged labor, need for further intervention, risk of cesarean section.  Patient understand these risks and wish to proceed.  Options of misoprostol foley bulb, artificial rupture of membranes, and pitocin reviewed, with use of each discussed in detail. All questions answered.  Induction of labor scheduled, orders have been signed and held. She was told to expect a call from [facility] L&D with further instructions about the induction of labor.  She was given handout about things can help with cervical ripening.  ? ?Labor symptoms and general obstetric precautions including but not limited to vaginal bleeding, contractions, leaking of fluid and fetal movement were reviewed in detail with the patient.   ?I discussed the assessment and treatment plan with the patient. The patient was provided an opportunity to ask questions and all were answered. The patient agreed with the plan and demonstrated an understanding of the instructions. The patient was advised to call back or seek an in-person office evaluation/go to MAU at [facility] for any urgent or concerning symptoms. ?Please refer to After Visit Summary for other counseling recommendations.  ? ?I provided 15 minutes of face-to-face time during this encounter. ? ?Return in about 5 weeks (around 11/26/2021) for Postpartum check. ? ?Future Appointments  ?Date Time Provider Department Center  ?10/27/2021 12:00 AM [facility] SCHED ROOM [facility] None  ? ? ?Jaynie Collins, MD ?Center for [employer], [facility] ? ?

## 2021-10-22 NOTE — Patient Instructions (Addendum)
Induction scheduled on 10/27/21 at midnight, patient will come to hospital  ?Saturday 10/26/21 at 11:45 pm ? ?Cervical Ripening (to get your cervix ready for labor) : May try one or all: ? ?Red Raspberry Leaf capsules:  two 300mg  or 400mg  tablets with each meal, 2-3 times a day  ?Potential Side Effects Of Raspberry Leaf:  ?Most women do not experience any side effects from drinking raspberry leaf tea. However, nausea and loose stools are possible  ? ? ? ?Evening Primrose Oil capsules: may take 1 to 3 capsules daily. May also prick one to release the oil and insert it into your vagina at night.  ?Some of the potential side effects:  ?Upset stomach  ?Loose stools or diarrhea  ?Headaches  ?Nausea ? ? ?6 Dates a day (may taste better if warmed in microwave until soft). Found where raisins are in the grocery store ? ? ?Return to office for any scheduled appointments. Call the office or go to the MAU at [facility] if: ?You begin to have strong, frequent contractions ?Your water breaks.  Sometimes it is a big gush of fluid, sometimes it is just a trickle that keeps getting your panties wet or running down your legs ?You have vaginal bleeding.  It is normal to have a small amount of spotting if your cervix was checked.  ?You do not feel your baby moving like normal.  If you do not, get something to eat and drink and lay down and focus on feeling your baby move.   If your baby is still not moving like normal, you should call the office or go to MAU. ?Any other obstetric concerns. ? ?

## 2021-10-23 ENCOUNTER — Other Ambulatory Visit: Payer: Self-pay

## 2021-10-23 ENCOUNTER — Telehealth (HOSPITAL_COMMUNITY): Payer: Self-pay | Admitting: *Deleted

## 2021-10-23 ENCOUNTER — Inpatient Hospital Stay (HOSPITAL_COMMUNITY)
Admission: AD | Admit: 2021-10-23 | Discharge: 2021-10-24 | DRG: 807 | Disposition: A | Discharge status: Home / Self Care | Payer: Medicaid Other | Attending: Obstetrics & Gynecology | Admitting: Obstetrics & Gynecology

## 2021-10-23 ENCOUNTER — Encounter (HOSPITAL_COMMUNITY): Payer: Self-pay | Admitting: Obstetrics and Gynecology

## 2021-10-23 DIAGNOSIS — O4202 Full-term premature rupture of membranes, onset of labor within 24 hours of rupture: Secondary | ICD-10-CM | POA: Diagnosis not present

## 2021-10-23 DIAGNOSIS — Z3A39 39 weeks gestation of pregnancy: Secondary | ICD-10-CM | POA: Diagnosis not present

## 2021-10-23 DIAGNOSIS — O480 Post-term pregnancy: Secondary | ICD-10-CM | POA: Diagnosis present

## 2021-10-23 DIAGNOSIS — O4292 Full-term premature rupture of membranes, unspecified as to length of time between rupture and onset of labor: Principal | ICD-10-CM | POA: Diagnosis present

## 2021-10-23 LAB — TYPE AND SCREEN
ABO/RH(D): O POS
Antibody Screen: NEGATIVE

## 2021-10-23 LAB — CBC
HCT: 40.1 % (ref 36.0–46.0)
Hemoglobin: 13.9 g/dL (ref 12.0–15.0)
MCH: 30 pg (ref 26.0–34.0)
MCHC: 34.7 g/dL (ref 30.0–36.0)
MCV: 86.6 fL (ref 80.0–100.0)
Platelets: 237 10*3/uL (ref 150–400)
RBC: 4.63 MIL/uL (ref 3.87–5.11)
RDW: 13.8 % (ref 11.5–15.5)
WBC: 9.1 10*3/uL (ref 4.0–10.5)
nRBC: 0 % (ref 0.0–0.2)

## 2021-10-23 LAB — RPR: RPR Ser Ql: NONREACTIVE

## 2021-10-23 MED ORDER — OXYTOCIN BOLUS FROM INFUSION
333.0000 mL | Freq: Once | INTRAVENOUS | Status: AC
  Administered 2021-10-23: 333 mL via INTRAVENOUS

## 2021-10-23 MED ORDER — FENTANYL CITRATE (PF) 100 MCG/2ML IJ SOLN: 50.0000 ug | INTRAMUSCULAR | Status: DC | PRN

## 2021-10-23 MED ORDER — ZOLPIDEM TARTRATE 5 MG PO TABS
5.0000 mg | ORAL_TABLET | Freq: Every evening | ORAL | Status: DC | PRN
Start: 2021-10-23 — End: 2021-10-23

## 2021-10-23 MED ORDER — ONDANSETRON HCL 4 MG/2ML IJ SOLN: 4.0000 mg | Freq: Four times a day (QID) | INTRAMUSCULAR | Status: DC | PRN

## 2021-10-23 MED ORDER — OXYCODONE-ACETAMINOPHEN 5-325 MG PO TABS: 1.0000 | ORAL_TABLET | ORAL | Status: DC | PRN

## 2021-10-23 MED ORDER — MISOPROSTOL 25 MCG QUARTER TABLET: 25.0000 ug | ORAL_TABLET | ORAL | Status: DC | PRN

## 2021-10-23 MED ORDER — DIBUCAINE (PERIANAL) 1 % EX OINT: 1.0000 "application " | TOPICAL_OINTMENT | CUTANEOUS | Status: DC | PRN

## 2021-10-23 MED ORDER — DIPHENHYDRAMINE HCL 25 MG PO CAPS: 25.0000 mg | ORAL_CAPSULE | Freq: Four times a day (QID) | ORAL | Status: DC | PRN

## 2021-10-23 MED ORDER — OXYCODONE-ACETAMINOPHEN 5-325 MG PO TABS: 2.0000 | ORAL_TABLET | ORAL | Status: DC | PRN

## 2021-10-23 MED ORDER — SENNOSIDES-DOCUSATE SODIUM 8.6-50 MG PO TABS
2.0000 | ORAL_TABLET | ORAL | Status: DC
  Administered 2021-10-23: 2 via ORAL
  Filled 2021-10-23: qty 2

## 2021-10-23 MED ORDER — TERBUTALINE SULFATE 1 MG/ML IJ SOLN: 0.2500 mg | Freq: Once | INTRAMUSCULAR | Status: DC | PRN

## 2021-10-23 MED ORDER — WITCH HAZEL-GLYCERIN EX PADS: 1.0000 "application " | MEDICATED_PAD | CUTANEOUS | Status: DC | PRN

## 2021-10-23 MED ORDER — ACETAMINOPHEN 325 MG PO TABS
650.0000 mg | ORAL_TABLET | ORAL | Status: DC | PRN
  Administered 2021-10-23: 650 mg via ORAL
  Filled 2021-10-23: qty 2

## 2021-10-23 MED ORDER — LACTATED RINGERS IV SOLN: 500.0000 mL | INTRAVENOUS | Status: DC | PRN

## 2021-10-23 MED ORDER — ONDANSETRON HCL 4 MG/2ML IJ SOLN: 4.0000 mg | INTRAMUSCULAR | Status: DC | PRN

## 2021-10-23 MED ORDER — ACETAMINOPHEN 325 MG PO TABS
650.0000 mg | ORAL_TABLET | ORAL | Status: DC | PRN
  Administered 2021-10-23 – 2021-10-24 (×2): 650 mg via ORAL
  Filled 2021-10-23 (×2): qty 2

## 2021-10-23 MED ORDER — ZOLPIDEM TARTRATE 5 MG PO TABS: 5.0000 mg | ORAL_TABLET | Freq: Every evening | ORAL | Status: DC | PRN

## 2021-10-23 MED ORDER — FLEET ENEMA 7-19 GM/118ML RE ENEM: 1.0000 | ENEMA | Freq: Every day | RECTAL | Status: DC | PRN

## 2021-10-23 MED ORDER — OXYTOCIN-SODIUM CHLORIDE 30-0.9 UT/500ML-% IV SOLN: 1.0000 m[IU]/min | INTRAVENOUS | Status: DC

## 2021-10-23 MED ORDER — LIDOCAINE HCL (PF) 1 % IJ SOLN: 30.0000 mL | INTRAMUSCULAR | Status: DC | PRN

## 2021-10-23 MED ORDER — FENTANYL CITRATE (PF) 100 MCG/2ML IJ SOLN
INTRAMUSCULAR | Status: AC
  Filled 2021-10-23: qty 2

## 2021-10-23 MED ORDER — FENTANYL CITRATE (PF) 100 MCG/2ML IJ SOLN
50.0000 ug | Freq: Once | INTRAMUSCULAR | Status: AC
  Administered 2021-10-23: 50 ug via INTRAVENOUS

## 2021-10-23 MED ORDER — COCONUT OIL OIL: 1.0000 "application " | TOPICAL_OIL | Status: DC | PRN

## 2021-10-23 MED ORDER — TETANUS-DIPHTH-ACELL PERTUSSIS 5-2.5-18.5 LF-MCG/0.5 IM SUSY: 0.5000 mL | PREFILLED_SYRINGE | Freq: Once | INTRAMUSCULAR | Status: DC

## 2021-10-23 MED ORDER — OXYTOCIN-SODIUM CHLORIDE 30-0.9 UT/500ML-% IV SOLN
2.5000 [IU]/h | INTRAVENOUS | Status: DC
  Filled 2021-10-23: qty 500

## 2021-10-23 MED ORDER — LACTATED RINGERS IV SOLN: INTRAVENOUS | Status: DC

## 2021-10-23 MED ORDER — SIMETHICONE 80 MG PO CHEW: 80.0000 mg | CHEWABLE_TABLET | ORAL | Status: DC | PRN

## 2021-10-23 MED ORDER — ONDANSETRON HCL 4 MG PO TABS: 4.0000 mg | ORAL_TABLET | ORAL | Status: DC | PRN

## 2021-10-23 MED ORDER — PRENATAL MULTIVITAMIN CH
1.0000 | ORAL_TABLET | Freq: Every day | ORAL | Status: DC
  Administered 2021-10-23: 1 via ORAL
  Filled 2021-10-23: qty 1

## 2021-10-23 MED ORDER — BENZOCAINE-MENTHOL 20-0.5 % EX AERO: 1.0000 "application " | INHALATION_SPRAY | CUTANEOUS | Status: DC | PRN

## 2021-10-23 MED ORDER — HYDROXYZINE HCL 50 MG PO TABS: 50.0000 mg | ORAL_TABLET | Freq: Four times a day (QID) | ORAL | Status: DC | PRN

## 2021-10-23 MED ORDER — IBUPROFEN 600 MG PO TABS
600.0000 mg | ORAL_TABLET | Freq: Four times a day (QID) | ORAL | Status: DC
  Administered 2021-10-23 – 2021-10-24 (×4): 600 mg via ORAL
  Filled 2021-10-23 (×4): qty 1

## 2021-10-23 MED ORDER — SOD CITRATE-CITRIC ACID 500-334 MG/5ML PO SOLN: 30.0000 mL | ORAL | Status: DC | PRN

## 2021-10-23 NOTE — Telephone Encounter (Signed)
Preadmission screen  

## 2021-10-23 NOTE — MAU Note (Signed)
Kathryn Mack is a 36 y.o. at [redacted]w[redacted]d here in MAU reporting: contractions that began after  ?SROM at 0445. Pt is grossly ruptured with clear fluid. Pt denies bleeding or bloody show. Endorses +fetal movement. ? ?Pain score: 7 ?There were no vitals filed for this visit.   ?FHT:135bpm ?Lab orders placed from triage:  mau labor ?

## 2021-10-23 NOTE — Lactation Note (Signed)
This note was copied from a baby's chart. ?Lactation Consultation Note ? ?Patient Name: Kathryn Mack ?Today's Date: 10/23/2021 ?  ?Age:36 hours ? ?LC in to consult with Mom.  RN asked Mom if she would like to see [facility] and Mom requested LC return later.   ? ? ?[name] ?10/23/2021, 9:24 AM ? ? ? ?

## 2021-10-23 NOTE — Lactation Note (Signed)
This note was copied from a baby's chart. ?Lactation Consultation Note ? ?Patient Name: Kathryn Mack ?Today's Date: 10/23/2021 ?  ?Age:36 hours ? ?LC attempted to visit P2 Mom again, but RN stated Mom was asking for privacy to try to sleep.  Sign on door.  LC to be notified by RN when Mom would like [place]. ? ?Broadus John ?10/23/2021, 2:10 PM ? ? ? ?

## 2021-10-23 NOTE — MAU Note (Signed)
Transferred to LD via stretcher. Pt remains well controlled with contractions without urge to push.  ?

## 2021-10-23 NOTE — Lactation Note (Signed)
This note was copied from a baby's chart. ?Lactation Consultation Note ? ?Patient Name: Kathryn Mack ?Today's Date: 10/23/2021 ?Reason for consult: Initial assessment;Mother's request;Term;Breastfeeding assistance ?Age:36 hours ? ?Mom experienced with breastfeeding infant 2 feedings at 3:30 and 4:05 pm for total of 30 min. Infant resting comfortably on [place] arrival.  ? ?LC went offer feeding cues, latch positions and signs of milk transfer.  ? ?Plan 1. To feed based on cues 8-12x 24hr period. Mom to offer breasts and look for signs of milk transfer.  ?2. If unable to latch, Mom to hand express and offer EBM on spoon 5-7 ml per feeding.  ?3. I and O sheet provided.  ? ?Mom stated latch had been shallow. Mom to call for latch assistance with next feeding. Mom asked for manual pump but is resting now. We will go over parts and usage with next latch.  ? ?All questions answered at the end of the visit.  ? ?Maternal Data ?Has patient been taught Hand Expression?: Yes ?Does the patient have breastfeeding experience prior to this delivery?: Yes ?How long did the patient breastfeed?: infant breast fed for 1 year ? ?Feeding ?Mother's Current Feeding Choice: Breast Milk ? ?LATCH Score ?  ? ?  ? ?  ? ?  ? ?  ? ?  ? ? ?Lactation Tools Discussed/Used ?  ? ?Interventions ?Interventions: Breast feeding basics reviewed;Skin to skin;Breast massage;Hand express;Breast compression;Position options;Support pillows;Expressed milk;Hand pump;Education;Buyer, retail brochure ? ?Discharge ?Pump: Manual ?WIC Program: Yes ? ?Consult Status ?Consult Status: Follow-up ?Date: 10/24/21 ?Follow-up type: In-patient ? ? ? ?Airel Magadan  Nicholson-Springer ?10/23/2021, 4:53 PM ? ? ? ?

## 2021-10-23 NOTE — Discharge Summary (Signed)
? ?  Postpartum Discharge Summary ? ?   ?Patient Name: Kathryn Mack ?DOB: 06/02/1986 ?MRN: [id number] ? ?Date of admission: 10/23/2021 ?Delivery date:10/23/2021  ?Delivering provider: Hansel Feinstein L  ?Date of discharge: 10/24/2021 ? ?Admitting diagnosis: Post term pregnancy over 40 weeks [O48.0] ?                                     PROM at term ?                                     Active Labor (First Stage Labor) ?Intrauterine pregnancy: [redacted]w[redacted]d    ?Secondary diagnosis:  Principal Problem: ?  Post term pregnancy over 40 weeks ?Active Problems: ?  Vaginal delivery ? ?Additional problems: none    ?Discharge diagnosis: Term Pregnancy Delivered                                              ?Post partum procedures: None ?Augmentation: N/A ?Complications: None ? ?Hospital course: Onset of Labor With Vaginal Delivery      ?36y.o. yo G2P1001 at 351w3das admitted in Active Labor on 10/23/2021. Patient had an uncomplicated labor course as follows:  ?Membrane Rupture Time/Date: 5:24 AM ,10/23/2021   ?Delivery Method:Vaginal, Spontaneous  ?Episiotomy: None  ?Lacerations:  1st degree  ?Patient had an uncomplicated postpartum course.  She is ambulating, tolerating a regular diet, passing flatus, and urinating well. Patient is discharged home in stable condition on 10/24/21. ? ?Newborn Data: ?Birth date:10/23/2021  ?Birth time:6:19 AM  ?Gender:Female  ?Living status:Living  ?Apgars:9 ,9  ?Weight:3305 g  ? ?Magnesium Sulfate received: No ?BMZ received: No ?Rhophylac:N/A ?MMR:N/A ?T-DaP:Given prenatally ?Flu: N/A ?Transfusion:No ? ?Physical exam  ?Vitals:  ? 10/23/21 1320 10/23/21 1720 10/23/21 2133 10/24/21 0700  ?BP: 114/83 116/79 115/78 120/89  ?Pulse: 79 77 80 (!) 108  ?Resp: _0 ?Temp: 98.4 ?F (36.9 ?C) 97.9 ?F (36.6 ?C) 99.5 ?F (37.5 ?C) 98.4 ?F (36.9 ?C)  ?TempSrc: Oral Axillary Oral Oral  ?SpO2: 99% 97% 98% 98%  ?Weight:      ?Height:      ? ?General: alert, cooperative, and no distress ?Lochia:  appropriate ?Uterine Fundus: firm ?Incision: N/A ?DVT Evaluation: No cords or calf tenderness. ?No significant calf/ankle edema. ?Labs: ?Lab Results  ?Component Value Date  ? WBC 9.1 10/23/2021  ? HGB 13.9 10/23/2021  ? HCT 40.1 10/23/2021  ? MCV 86.6 10/23/2021  ? PLT 237 10/23/2021  ? ? ?  Latest Ref Rng & Units 04/04/2021  ?  2:45 PM  ?CMP  ?Glucose 65 - 99 mg/dL 78    ?BUN 6 - 20 mg/dL 7    ?Creatinine 0.57 - 1.00 mg/dL 0.65    ?Sodium 134 - 144 mmol/L 139    ?Potassium 3.5 - 5.2 mmol/L 3.7    ?Chloride 96 - 106 mmol/L 101    ?CO2 20 - 29 mmol/L 22    ?Calcium 8.7 - 10.2 mg/dL 9.7    ?Total Protein 6.0 - 8.5 g/dL 7.1    ?Total Bilirubin 0.0 - 1.2 mg/dL 0.4    ?Alkaline Phos 44 - 121 IU/L 70    ?AST 0 - 40 IU/L  16    ?ALT 0 - 32 IU/L 12    ? ?Edinburgh Score: ? ?  10/24/2021  ?  9:56 AM  ?Flavia Shipper Postnatal Depression Scale Screening Tool  ?I have been able to laugh and see the funny side of things. 0  ?I have looked forward with enjoyment to things. 0  ?I have blamed myself unnecessarily when things went wrong. 0  ?I have been anxious or worried for no good reason. 0  ?I have felt scared or panicky for no good reason. 0  ?Things have been getting on top of me. 1  ?I have been so unhappy that I have had difficulty sleeping. 0  ?I have felt sad or miserable. 0  ?I have been so unhappy that I have been crying. 0  ?The thought of harming myself has occurred to me. 0  ?Edinburgh Postnatal Depression Scale Total 1  ? ? ? ? ?After visit meds:  ?Allergies as of 10/24/2021   ? ?   Reactions  ? Grass Pollen(k-o-r-t-swt Vern)   ? ?  ? ?  ?Medication List  ?  ? ?TAKE these medications   ? ?acetaminophen 500 MG tablet ?Commonly known as: TYLENOL ?Take 2 tablets (1,000 mg total) by mouth every 8 (eight) hours as needed (pain). ?  ?Blood Pressure Monitor Automat Devi ?1 Device by Does not apply route daily. Automatic blood pressure cuff regular size. To monitor blood pressure regularly at home. ICD-10 code:Z34.90 ?  ?cetirizine  10 MG tablet ?Commonly known as: ZYRTEC ?Take 1 tablet (10 mg total) by mouth daily. ?  ?folic acid 465 MCG tablet ?Commonly known as: FOLVITE ?Take 1 mg by mouth daily. ?  ?Gojji Weight Scale Misc ?1 Device by Does not apply route daily as needed. To weight self daily as needed at home. ICD-10 code: Z34.90 ?  ?ibuprofen 600 MG tablet ?Commonly known as: ADVIL ?Take 1 tablet (600 mg total) by mouth every 6 (six) hours. ?  ?prenatal multivitamin Tabs tablet ?Take 1 tablet by mouth daily at 12 noon. ?  ? ?  ? ?Please schedule this patient for Postpartum visit in: 4 weeks with the following provider: Any provider ?In-Person ?For C/S patients schedule nurse incision check in weeks 2 weeks: no ?Low risk pregnancy complicated by:  Bartholins Cyst ?Delivery mode:  SVD ?Anticipated Birth Control:  Vasectomy ?PP Procedures needed:  none   ?Edinburgh: negative Schedule Integrated BH visit: no  ?No relevant baby issues ? ? ?Discharge home in stable condition ?Infant Feeding: Breast ?Infant Disposition:home with mother ?Discharge instruction: per After Visit Summary and Postpartum booklet. ?Activity: Advance as tolerated. Pelvic rest for 6 weeks.  ?Diet: routine diet ?Anticipated Birth Control:  Vasectomy ?Postpartum Appointment:4 weeks ?Additional Postpartum F/U: NA ?Future Appointments: ?Future Appointments  ?Date Time Provider [place]  ?11/27/2021 11:15 AM Donnamae Jude, MD [facility]  ? ?Follow up Visit: ? ? ? Daniel Nones, MD ?FM PGY-1 ? ? ?10/24/2021 ? ? ? ?

## 2021-10-23 NOTE — H&P (Signed)
Kathryn Mack is a 36 y.o. female presenting for Active Labor and ruptured membranes.   States SROM at .[id number] with onset of labor soon thereafter. ?Upon arrival in [facility] her cervix is 9cm ? ?Was scheduled for IOL on Sunday, April 2.  Pregnancy has been low risk and remarkable for Bartholins Cyst and GBS neg..  ? ?OB History   ? ? Gravida  ?2  ? Para  ?1  ? Term  ?1  ? Preterm  ?   ? AB  ?   ? Living  ?1  ?  ? ? SAB  ?   ? IAB  ?   ? Ectopic  ?   ? Multiple  ?0  ? Live Births  ?1  ?   ?  ?  ? ?Past Medical History:  ?Diagnosis Date  ? Full-term premature rupture of membranes 01/25/2020  ? Gonorrhea 2010  ? Medical history non-contributory   ? Supervision of normal first pregnancy, antepartum 01/09/2020  ?  Nursing Staff Provider  Office Location  [facility] Dating  LMP 05/09/19  Language  English Anatomy US  Normal per scanned records   Flu Vaccine   Genetic Screen  Quad: screen negative   TDaP vaccine   01/18/2020 Hgb A1C or  GTT Early  Third trimester   Rhogam  N/A   LAB RESULTS     Blood Type O/Positive/-- (12/23 0000)   Feeding Plan Breast Antibody Negative (12/23 0000)  Contraception Undecide  ? ?Past Surgical History:  ?Procedure Laterality Date  ? LEEP  2013  ? ?Family History: family history includes Arthritis in her father; High blood pressure in her mother; Hypertension in her father. ?Social History:  reports that she has never smoked. She has never been exposed to tobacco smoke. She has never used smokeless tobacco. She reports that she does not currently use alcohol. She reports that she does not currently use drugs after having used the following drugs: Marijuana. ? ? ?  ?Maternal Diabetes: No ?Genetic Screening: Normal ?Maternal Ultrasounds/Referrals: Normal ?Fetal Ultrasounds or other Referrals:  None ?Maternal Substance Abuse:  No ?Significant Maternal Medications:  None ?Significant Maternal Lab Results:  Group B Strep negative ?Other Comments:  None ? ?Review of Systems  ?Constitutional:  Negative  for chills and fever.  ?Eyes:  Negative for visual disturbance.  ?Respiratory:  Negative for shortness of breath.   ?Gastrointestinal:  Positive for abdominal pain. Negative for constipation, diarrhea, nausea and vomiting.  ?Genitourinary:  Positive for pelvic pain and vaginal discharge. Negative for vaginal bleeding.  ?Neurological:  Negative for dizziness and weakness.  ?Maternal Medical History:  ?Reason for admission: Rupture of membranes and contractions.  ?Nausea. ? ?Contractions: Onset was 1-2 hours ago.   ?Frequency: regular.   ?Perceived severity is strong.   ?Fetal activity: Perceived fetal activity is normal.   ?Last perceived fetal movement was within the past hour.   ?Prenatal complications: No bleeding, PIH, placental abnormality, pre-eclampsia or preterm labor.   ?Prenatal Complications - Diabetes: none. ? ?Dilation: 9 ?Effacement (%): 90 ?Station: 0 ?Exam by:: [employer] RN ?Last menstrual period 01/20/2021, not currently breastfeeding. ?Maternal Exam:  ?Uterine Assessment: Contraction strength is firm.  Contraction frequency is regular.  ?Abdomen: Patient reports no abdominal tenderness. Estimated fetal weight is 7.   ?Fetal presentation: vertex ?Introitus: Normal vulva. Normal vagina.  Ferning test: positive.  ?Nitrazine test: not done. ?Amniotic fluid character: not assessed. ?Pelvis: adequate for delivery.   ?Cervix: Cervix evaluated by digital exam.   ? ? ?  Fetal Exam ?Fetal Monitor Review: Mode: ultrasound.   ?Baseline rate: 140.  ?Variability: moderate (6-25 bpm).   ?Pattern: accelerations present and no decelerations.   ?Fetal State Assessment: Category I - tracings are normal. ? ?Physical Exam ?Constitutional:   ?   General: She is not in acute distress (but very uncomfortable). ?   Appearance: She is not ill-appearing or toxic-appearing.  ?HENT:  ?   Head: Normocephalic.  ?Cardiovascular:  ?   Rate and Rhythm: Normal rate.  ?Pulmonary:  ?   Effort: Pulmonary effort is normal.   ?Abdominal:  ?   Tenderness: There is no abdominal tenderness. There is no guarding.  ?Genitourinary: ?   General: Normal vulva.  ?   Comments: Cervix 9cm per MAU nurse ?Musculoskeletal:     ?   General: Normal range of motion.  ?   Cervical back: Normal range of motion.  ?Skin: ?   General: Skin is warm and dry.  ?Neurological:  ?   General: No focal deficit present.  ?   Mental Status: She is alert.  ?Psychiatric:     ?   Mood and Affect: Mood normal.     ?   Behavior: Behavior normal.  ?  ?Prenatal labs: ?ABO, Rh: O/Positive/-- (09/08 1434) ?Antibody: Negative (09/08 1434) ?Rubella: 5.14 (09/08 1434) ?RPR: Non Reactive (12/20 0841)  ?HBsAg: Negative (09/08 1434)  ?HIV: Non Reactive (12/20 0841)  ?GBS: Negative/-- (03/07 1411)  ? ?Assessment/Plan: ?Single IUP at [redacted]w[redacted]d ?PROM at term ?Active Labor ?GBS Negative ? ?Admit to Labor and Delivery ?Prepare for Delivery, imminent  ? ?Wynelle Bourgeois ?10/23/2021, 6:12 AM ? ? ? ? ?

## 2021-10-24 MED ORDER — ACETAMINOPHEN 500 MG PO TABS: 1000.0000 mg | ORAL_TABLET | Freq: Three times a day (TID) | ORAL | 0 refills | Status: DC | PRN

## 2021-10-24 MED ORDER — IBUPROFEN 600 MG PO TABS: 600.0000 mg | ORAL_TABLET | Freq: Four times a day (QID) | ORAL | 0 refills | Status: DC

## 2021-10-24 NOTE — Lactation Note (Signed)
This note was copied from a baby's chart. ?Lactation Consultation Note ? ?Patient Name: Kathryn Mack ?Today's Date: 10/24/2021 ?Reason for consult: Follow-up assessment;Term ?Age:36 hours ? ?P2 mom, BF when LC arrived.  Baby dressed and swaddled in blankets.   Non -nutritive sucking observed.   ?LC suggested STS.  Mom relatched in football hold.  Infant had multiple sucks without swallows.  With compressions a few swallows were heard.   ? ?Stools 6 ?Voids 2 ?6% loss at time of visit. ? ?LC pointed out swallows and encouraged mom to listen for these to know if infant was swallowing.  Encouraged feeding with cues, offering both sides, hand expressing, and keeping baby STS.   ? ?Suggested mom see OP Soyla Dryer, RN, IBCLC for lactation support if needed.  Message sent.    ? ? ?Maternal Data ?Has patient been taught Hand Expression?: Yes ?Does the patient have breastfeeding experience prior to this delivery?: Yes ? ?Feeding ?Mother's Current Feeding Choice: Breast Milk ? ?LATCH Score ?Latch: Grasps breast easily, tongue down, lips flanged, rhythmical sucking. ? ?Audible Swallowing: A few with stimulation (compressions and stimulation to baby needed; few swallows heard) ? ?Type of Nipple: Everted at rest and after stimulation ? ?Comfort (Breast/Nipple): Soft / non-tender ? ?Hold (Positioning): Assistance needed to correctly position infant at breast and maintain latch. ? ?LATCH Score: 8 ? ? ?Lactation Tools Discussed/Used ?  ? ?Interventions ?Interventions: Education;Breast feeding basics reviewed;Skin to skin;Adjust position;Support pillows ? ?Discharge ?Discharge Education: Engorgement and breast care;Warning signs for feeding baby;Outpatient recommendation;Outpatient Epic message sent ?Pump: Personal;Manual ? ?Consult Status ?Consult Status: Complete ?Date: 10/24/21 ?Follow-up type: In-patient ? ? ? ?Maryruth Hancock Clothilde Tippetts ?10/24/2021, 10:29 AM ? ? ? ?

## 2021-10-25 ENCOUNTER — Telehealth: Payer: Self-pay | Admitting: *Deleted

## 2021-10-25 NOTE — Telephone Encounter (Signed)
Transition Care Management Unsuccessful Follow-up Telephone Call ? ?Date of discharge and from where:  [facility] ? ?Attempts:  1st Attempt ? ?Reason for unsuccessful TCM follow-up call:  Left voice message Following OB-Pregnancy.  ? ?  ?

## 2021-10-27 ENCOUNTER — Inpatient Hospital Stay (HOSPITAL_COMMUNITY): Payer: Medicaid Other

## 2021-10-27 ENCOUNTER — Inpatient Hospital Stay (HOSPITAL_COMMUNITY)
Admission: AD | Admit: 2021-10-27 | Payer: Medicaid Other | Source: Home / Self Care | Admitting: Obstetrics & Gynecology

## 2021-10-29 ENCOUNTER — Encounter: Payer: Medicaid Other | Admitting: Obstetrics & Gynecology

## 2021-11-05 ENCOUNTER — Telehealth (HOSPITAL_COMMUNITY): Payer: Self-pay

## 2021-11-05 NOTE — Telephone Encounter (Signed)
No answer. Left message to return nurse call. ? Heron Nay ?11/05/2021,1326 ?

## 2021-11-06 ENCOUNTER — Telehealth: Payer: Self-pay

## 2021-11-06 NOTE — Telephone Encounter (Signed)
Called pt to verify post partum appointment. Left vm to call office back.  ?

## 2021-11-26 ENCOUNTER — Other Ambulatory Visit (HOSPITAL_COMMUNITY)
Admission: RE | Admit: 2021-11-26 | Discharge: 2021-11-26 | Disposition: A | Discharge status: Home / Self Care | Payer: Medicaid Other | Source: Ambulatory Visit | Attending: Family Medicine | Admitting: Family Medicine

## 2021-11-26 ENCOUNTER — Encounter: Payer: Self-pay | Admitting: Obstetrics and Gynecology

## 2021-11-26 ENCOUNTER — Ambulatory Visit (INDEPENDENT_AMBULATORY_CARE_PROVIDER_SITE_OTHER): Payer: Medicaid Other | Admitting: Obstetrics and Gynecology

## 2021-11-26 NOTE — Progress Notes (Signed)
? ? ?Post Partum Visit Note ? ?Kathryn Mack is a 36 y.o. G30P2002 female who presents for a postpartum visit. She is 4 weeks postpartum following a normal spontaneous vaginal delivery.  I have fully reviewed the prenatal and intrapartum course. The delivery was at 39 gestational weeks.  Anesthesia: none. Postpartum course has been unremarkable. Baby is doing well. Baby is feeding by breast. Bleeding no bleeding. Bowel function is normal. Bladder function is normal. Patient is not sexually active. Contraception method is none. Postpartum depression screening: negative. ? ? ?The pregnancy intention screening data noted above was reviewed. Potential methods of contraception were discussed. The patient elected to proceed with No data recorded. ? ? Edinburgh Postnatal Depression Scale - 11/26/21 1127   ? ?  ? Edinburgh Postnatal Depression Scale:  In the Past 7 Days  ? I have been able to laugh and see the funny side of things. 0   ? I have looked forward with enjoyment to things. 0   ? I have blamed myself unnecessarily when things went wrong. 0   ? I have been anxious or worried for no good reason. 0   ? I have felt scared or panicky for no good reason. 0   ? Things have been getting on top of me. 0   ? I have been so unhappy that I have had difficulty sleeping. 0   ? I have felt sad or miserable. 0   ? I have been so unhappy that I have been crying. 0   ? The thought of harming myself has occurred to me. 0   ? Edinburgh Postnatal Depression Scale Total 0   ? ?  ?  ? ?  ? ? ?Health Maintenance Due  ?Topic Date Due  ? COVID-19 Vaccine (3 - Booster for Pfizer series) 07/14/2020  ? ? ?The following portions of the patient's history were reviewed and updated as appropriate: allergies, current medications, past family history, past medical history, past social history, past surgical history, and problem list. ? ?Review of Systems ?Pertinent items are noted in HPI. ? ?Objective:  ?BP 124/79   Pulse 87   Wt 181 lb (82.1  kg)   LMP 01/20/2021   Breastfeeding Yes   BMI 31.07 kg/m?   ? ?General:  alert, cooperative, and no distress  ? Breasts:  normal  ?Lungs: clear to auscultation bilaterally  ?Heart:  regular rate and rhythm, S1, S2 normal, no murmur, click, rub or gallop  ?Abdomen: soft, non-tender; bowel sounds normal; no masses,  no organomegaly   ?Wound N/a  ?GU exam:  normal  ?     ?Assessment:  ? ? There are no diagnoses linked to this encounter. ? ?4 week postpartum exam.  ? ?Plan:  ? ?Essential components of care per ACOG recommendations: ? ?1.  Mood and well being: Patient with negative depression screening today. Reviewed local resources for support.  ?- Patient tobacco use? No.   ?- hx of drug use? No.   ? ?2. Infant care and feeding:  ?-Patient currently breastmilk feeding? Yes. Reviewed importance of draining breast regularly to support lactation.  ?-Social determinants of health (SDOH) reviewed in EPIC. No concerns ? ?3. Sexuality, contraception and birth spacing ?- Patient does not want a pregnancy in the next year.  Desired family size is 2 children.  ?- Reviewed reproductive life planning. Reviewed contraceptive methods based on pt preferences and effectiveness.  Patient desired Vasectomy today.   ?- Discussed birth spacing of 18 months ? ?  4. Sleep and fatigue ?-Encouraged family/partner/community support of 4 hrs of uninterrupted sleep to help with mood and fatigue ? ?5. Physical Recovery  ?- Discussed patients delivery and complications. She describes her labor as good. ?- Patient had a Vaginal, no problems at delivery. Patient had a 1st degree laceration. Perineal healing reviewed. Patient expressed understanding ?- Patient has urinary incontinence? No. ?- Patient is safe to resume physical and sexual activity ? ?6.  Health Maintenance ?- HM due items addressed Yes ?- Last pap smear No results found for: DIAGPAP Pap smear done at today's visit.  ?-Breast Cancer screening indicated? No.  ? ?7. Chronic  Disease/Pregnancy Condition follow up: None ? ?- PCP follow up ? ?Milas Hock, MD ?Center for [facility], [facility]  ?

## 2021-11-27 ENCOUNTER — Ambulatory Visit: Payer: Medicaid Other | Admitting: Family Medicine

## 2021-11-28 LAB — CYTOLOGY - PAP
Adequacy: ABSENT
Comment: NEGATIVE
Diagnosis: NEGATIVE
High risk HPV: NEGATIVE

## 2022-08-18 ENCOUNTER — Ambulatory Visit: Payer: Medicaid Other | Admitting: Family

## 2022-08-18 ENCOUNTER — Ambulatory Visit (INDEPENDENT_AMBULATORY_CARE_PROVIDER_SITE_OTHER): Payer: Medicaid Other | Admitting: Adult Health

## 2022-08-18 ENCOUNTER — Encounter: Payer: Self-pay | Admitting: Adult Health

## 2022-08-18 VITALS — BP 110/70 | HR 90 | Temp 97.8°F | Resp 18 | Ht 64.0 in | Wt 179.0 lb

## 2022-08-18 DIAGNOSIS — R110 Nausea: Secondary | ICD-10-CM

## 2022-08-18 LAB — POCT URINE PREGNANCY: Preg Test, Ur: NEGATIVE

## 2022-08-18 NOTE — Patient Instructions (Signed)
Nausea, Adult Nausea is the feeling of having an upset stomach or that you are about to vomit. Nausea on its own is not usually a serious concern, but it may be an early sign of a more serious medical problem. As nausea gets worse, it can lead to vomiting. If vomiting develops, or if you are not able to drink enough fluids, you are at risk of becoming dehydrated. Dehydration can make you tired and thirsty, cause you to have a dry mouth, and decrease how often you urinate. Older adults and people with other diseases or a weak disease-fighting system (immune system) are at higher risk for dehydration. The main goals of treating your nausea are: To relieve your nausea. To limit repeated nausea episodes. To prevent vomiting and dehydration. Follow these instructions at home: Watch your symptoms for any changes. Tell your health care provider about them. Eating and drinking     Take an oral rehydration solution (ORS). This is a drink that is sold at pharmacies and retail stores. Drink clear fluids slowly and in small amounts as you are able. Clear fluids include water, ice chips, low-calorie sports drinks, and fruit juice that has water added (diluted fruit juice). Eat bland, easy-to-digest foods in small amounts as you are able. These foods include bananas, applesauce, rice, lean meats, toast, and crackers. Avoid drinking fluids that contain a lot of sugar or caffeine, such as energy drinks, sports drinks, and soda. Avoid alcohol. Avoid spicy or fatty foods. General instructions Take over-the-counter and prescription medicines only as told by your health care provider. Rest at home while you recover. Drink enough fluid to keep your urine pale yellow. Breathe slowly and deeply when you feel nauseous. Avoid smelling things that have strong odors. Wash your hands often using soap and water for at least 20 seconds. If soap and water are not available, use hand sanitizer. Make sure that everyone in  your household washes their hands well and often. Keep all follow-up visits. This is important. Contact a health care provider if: Your nausea gets worse. Your nausea does not go away after two days. You vomit multiple times. You cannot drink fluids without vomiting. You have any of the following: New symptoms. A fever. A headache. Muscle cramps. A rash. Pain while urinating. You feel light-headed or dizzy. Get help right away if: You have pain in your chest, neck, arm, or jaw. You feel extremely weak or you faint. You have vomit that is bright red or looks like coffee grounds. You have bloody or black stools (feces) or stools that look like tar. You have a severe headache, a stiff neck, or both. You have severe pain, cramping, or bloating in your abdomen. You have difficulty breathing or are breathing very quickly. Your heart is beating very quickly. Your skin feels cold and clammy. You feel confused. You have signs of dehydration, such as: Dark urine, very little urine, or no urine. Cracked lips. Dry mouth. Sunken eyes. Sleepiness. Weakness. These symptoms may be an emergency. Get help right away. Call 911. Do not wait to see if the symptoms will go away. Do not drive yourself to the hospital. Summary Nausea is the feeling that you have an upset stomach or that you are about to vomit. Nausea on its own is not usually a serious concern, but it may be an early sign of a more serious medical problem. If vomiting develops, or if you are not able to drink enough fluids, you are at risk of becoming   dehydrated. Follow recommendations for eating and drinking and take over-the-counter and prescription medicines only as told by your health care provider. Contact a health care provider right away if your symptoms worsen or you have new symptoms. Keep all follow-up visits. This is important. This information is not intended to replace advice given to you by your health care provider.  Make sure you discuss any questions you have with your health care provider. Document Revised: 01/18/2021 Document Reviewed: 01/18/2021 Elsevier Patient Education  2023 [employer]  

## 2022-08-18 NOTE — Progress Notes (Signed)
[facility]  Provider:  Durenda Age DNP  Code Status:  Full Code  Goals of Care:     10/23/2021    6:41 AM  Advanced Directives  Does Patient Have a Medical Advance Directive? No  Would patient like information on creating a medical advance directive? No - Patient declined     Chief Complaint  Patient presents with   Acute Visit    Patient would like to have blood levels checked,HCG test Nausea and fatigue    HPI: Patient is a 37 y.o. female seen today for an acute visit for pregnancy test, nausea and fatigue. She has been feeling weak for the past week. She has been dieting for 3 weeks for weight loss. She has been cutting processed food and sweets. She stated that her husband had vasectomy and wants to make sure she is not pregnant.  Past Medical History:  Diagnosis Date   Full-term premature rupture of membranes 01/25/2020   Gonorrhea 2010   Medical history non-contributory    Supervision of normal first pregnancy, antepartum 01/09/2020    Nursing Staff Provider  Office Location  [facility] Dating  LMP 05/09/19  Language  English Anatomy US  Normal per scanned records   Flu Vaccine   Genetic Screen  Quad: screen negative   TDaP vaccine   01/18/2020 Hgb A1C or  GTT Early  Third trimester   Rhogam  N/A   LAB RESULTS     Blood Type O/Positive/-- (12/23 0000)   Feeding Plan Breast Antibody Negative (12/23 0000)  Contraception Undecide    Past Surgical History:  Procedure Laterality Date   LEEP  2013    Allergies  Allergen Reactions   Grass Pollen([id number]-Swt Vern)     Outpatient Encounter Medications as of 08/18/2022  Medication Sig   cetirizine (ZYRTEC) 10 MG tablet Take 1 tablet (10 mg total) by mouth daily.   folic acid (FOLVITE) 361 MCG tablet Take 1 mg by mouth daily.   Prenatal Vit-Fe Fumarate-FA (PRENATAL MULTIVITAMIN) TABS tablet Take 1 tablet by mouth daily at 12 noon.   [DISCONTINUED] acetaminophen (TYLENOL) 500 MG tablet Take 2 tablets (1,000 mg  total) by mouth every 8 (eight) hours as needed (pain). (Patient not taking: Reported on 11/26/2021)   [DISCONTINUED] Blood Pressure Monitoring (BLOOD PRESSURE MONITOR AUTOMAT) DEVI 1 Device by Does not apply route daily. Automatic blood pressure cuff regular size. To monitor blood pressure regularly at home. ICD-10 code:Z34.90 (Patient not taking: Reported on 09/04/2021)   [DISCONTINUED] ibuprofen (ADVIL) 600 MG tablet Take 1 tablet (600 mg total) by mouth every 6 (six) hours. (Patient not taking: Reported on 11/26/2021)   [DISCONTINUED] Misc. Devices (GOJJI WEIGHT SCALE) MISC 1 Device by Does not apply route daily as needed. To weight self daily as needed at home. ICD-10 code: Z34.90 (Patient not taking: Reported on 09/04/2021)   No facility-administered encounter medications on file as of 08/18/2022.    Review of Systems:  Review of Systems  Constitutional:  Negative for appetite change, chills, fatigue and fever.  HENT:  Negative for congestion, hearing loss, rhinorrhea and sore throat.   Eyes: Negative.   Respiratory:  Negative for cough, shortness of breath and wheezing.   Cardiovascular:  Negative for chest pain, palpitations and leg swelling.  Gastrointestinal:  Positive for nausea. Negative for abdominal pain, constipation, diarrhea and vomiting.  Genitourinary:  Negative for dysuria.  Musculoskeletal:  Negative for arthralgias, back pain and myalgias.  Skin:  Negative for color change, rash and wound.  Neurological:  Negative for dizziness, weakness and headaches.  Psychiatric/Behavioral:  Negative for behavioral problems. The patient is not nervous/anxious.     Health Maintenance  Topic Date Due   INFLUENZA VACCINE  02/25/2022   COVID-19 Vaccine (3 - 2023-24 season) 03/28/2022   PAP SMEAR-Modifier  11/26/2024   DTaP/Tdap/Td (3 - Td or Tdap) 09/18/2031   Hepatitis C Screening  Completed   HIV Screening  Completed   HPV VACCINES  Aged Out    Physical Exam: Vitals:   08/18/22 1513  08/18/22 1535  BP:  110/70  Pulse:  90  Resp:  18  Temp:  97.8 F (36.6 C)  SpO2:  99%  Weight:  179 lb (81.2 kg)  Height: 5\' 4"  (1.626 m) 5\' 4"  (1.626 m)   Body mass index is 30.73 kg/m. Physical Exam Constitutional:      Appearance: Normal appearance.  HENT:     Head: Normocephalic and atraumatic.     Nose: Nose normal.     Mouth/Throat:     Mouth: Mucous membranes are moist.  Eyes:     Conjunctiva/sclera: Conjunctivae normal.  Cardiovascular:     Rate and Rhythm: Normal rate and regular rhythm.  Pulmonary:     Effort: Pulmonary effort is normal.     Breath sounds: Normal breath sounds.  Abdominal:     General: Bowel sounds are normal.     Palpations: Abdomen is soft.  Musculoskeletal:        General: Normal range of motion.     Cervical back: Normal range of motion.  Skin:    General: Skin is warm and dry.  Neurological:     General: No focal deficit present.     Mental Status: She is alert and oriented to person, place, and time.  Psychiatric:        Mood and Affect: Mood normal.        Behavior: Behavior normal.        Thought Content: Thought content normal.        Judgment: Judgment normal.     Labs reviewed: Basic Metabolic Panel: No results for input(s): "NA", "K", "CL", "CO2", "GLUCOSE", "BUN", "CREATININE", "CALCIUM", "MG", "PHOS", "TSH" in the last 8760 hours. Liver Function Tests: No results for input(s): "AST", "ALT", "ALKPHOS", "BILITOT", "PROT", "ALBUMIN" in the last 8760 hours. No results for input(s): "LIPASE", "AMYLASE" in the last 8760 hours. No results for input(s): "AMMONIA" in the last 8760 hours. CBC: Recent Labs    10/23/21 0607  WBC 9.1  HGB 13.9  HCT 40.1  MCV 86.6  PLT 237   Lipid Panel: No results for input(s): "CHOL", "HDL", "LDLCALC", "TRIG", "CHOLHDL", "LDLDIRECT" in the last 8760 hours. Lab Results  Component Value Date   HGBA1C 5.2 04/04/2021    Procedures since last visit: No results  found.  Assessment/Plan 1. Nausea -  declines medication for nausea - POCT urine pregnancy was negative - CBC With Differential/Platelet - CMP with eGFR([facility])    Labs/tests ordered:  - POCT urine pregnancy - CBC With Differential/Platelet - CMP with eGFR([facility])  Next appt:  as needed

## 2022-08-19 ENCOUNTER — Ambulatory Visit: Payer: Medicaid Other | Admitting: Family

## 2022-08-19 LAB — CBC WITH DIFFERENTIAL/PLATELET
Absolute Monocytes: 474 cells/uL (ref 200–950)
Basophils Absolute: 32 cells/uL (ref 0–200)
Basophils Relative: 0.5 %
Eosinophils Absolute: 83 cells/uL (ref 15–500)
Eosinophils Relative: 1.3 %
HCT: 38.3 % (ref 35.0–45.0)
Hemoglobin: 12.9 g/dL (ref 11.7–15.5)
Lymphs Abs: 2438 cells/uL (ref 850–3900)
MCH: 30 pg (ref 27.0–33.0)
MCHC: 33.7 g/dL (ref 32.0–36.0)
MCV: 89.1 fL (ref 80.0–100.0)
MPV: 10.2 fL (ref 7.5–12.5)
Monocytes Relative: 7.4 %
Neutro Abs: 3373 cells/uL (ref 1500–7800)
Neutrophils Relative %: 52.7 %
Platelets: 268 10*3/uL (ref 140–400)
RBC: 4.3 10*6/uL (ref 3.80–5.10)
RDW: 12.3 % (ref 11.0–15.0)
Total Lymphocyte: 38.1 %
WBC: 6.4 10*3/uL (ref 3.8–10.8)

## 2022-08-19 LAB — COMPLETE METABOLIC PANEL WITH GFR
AG Ratio: 1.5 (calc) (ref 1.0–2.5)
ALT: 12 U/L (ref 6–29)
AST: 17 U/L (ref 10–30)
Albumin: 4.2 g/dL (ref 3.6–5.1)
Alkaline phosphatase (APISO): 59 U/L (ref 31–125)
BUN: 15 mg/dL (ref 7–25)
CO2: 26 mmol/L (ref 20–32)
Calcium: 9.5 mg/dL (ref 8.6–10.2)
Chloride: 106 mmol/L (ref 98–110)
Creat: 0.78 mg/dL (ref 0.50–0.97)
Globulin: 2.8 g/dL (calc) (ref 1.9–3.7)
Glucose, Bld: 78 mg/dL (ref 65–139)
Potassium: 4.1 mmol/L (ref 3.5–5.3)
Sodium: 141 mmol/L (ref 135–146)
Total Bilirubin: 0.4 mg/dL (ref 0.2–1.2)
Total Protein: 7 g/dL (ref 6.1–8.1)
eGFR: 101 mL/min/{1.73_m2} (ref >=60)

## 2023-01-21 IMAGING — US US OB COMP LESS 14 WK
1 series · 15 of 28 positions shown · non-contrast
Comparison: None

CLINICAL DATA: First trimester pregnancy, assess viability, confirm
dating; LMP 01/20/2021

EXAM:
OBSTETRIC <14 WK ULTRASOUND
TECHNIQUE: Transabdominal ultrasound was performed for evaluation of the
gestation as well as the maternal uterus and adnexal regions.

[Series 1: us ob comp less 14 wk · 70 acquisitions, 15 frames shown]
[im 1/70]
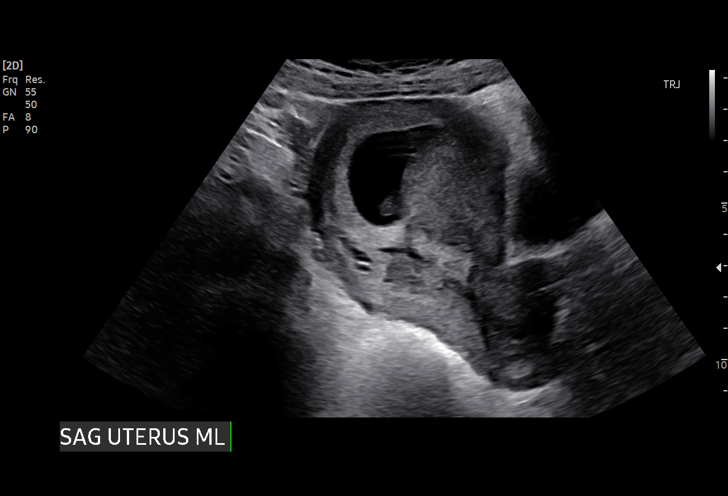
[im 6/70]
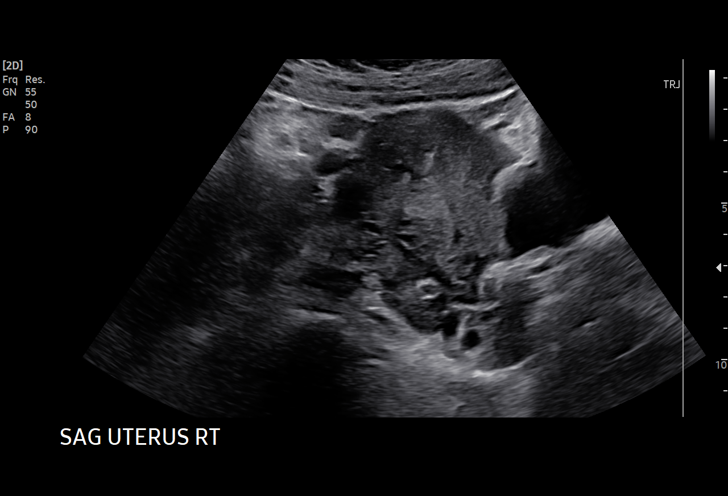
[im 11/70]
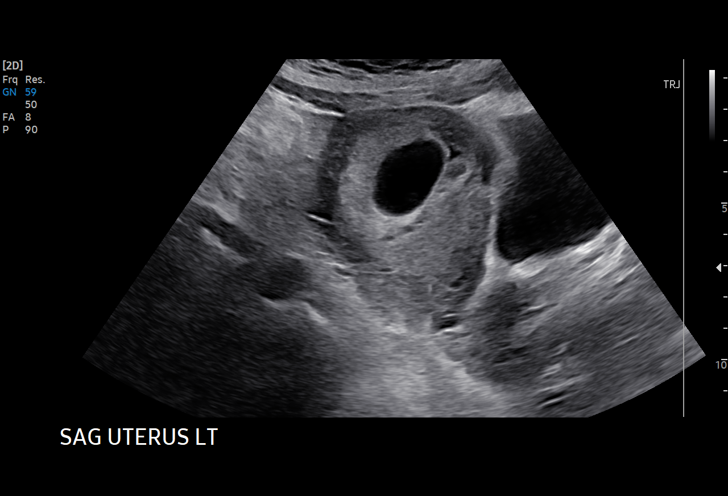
[im 16/70]
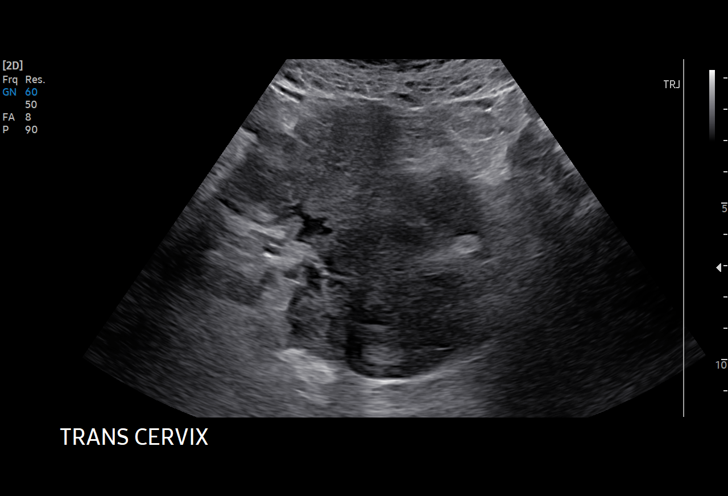
[im 21/70]
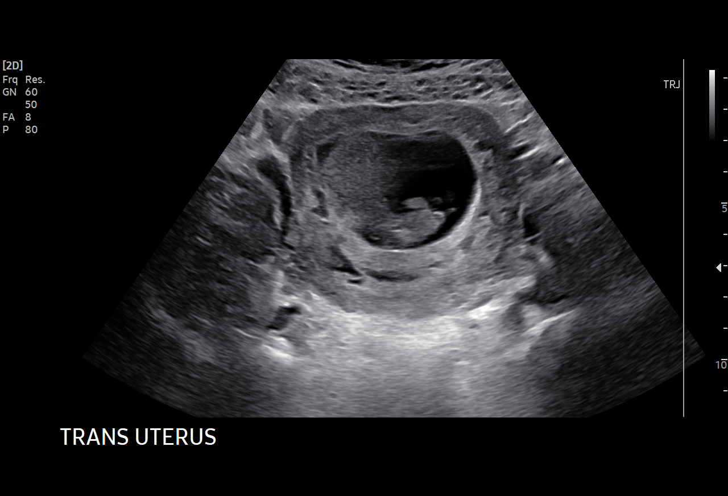
[im 26/70]
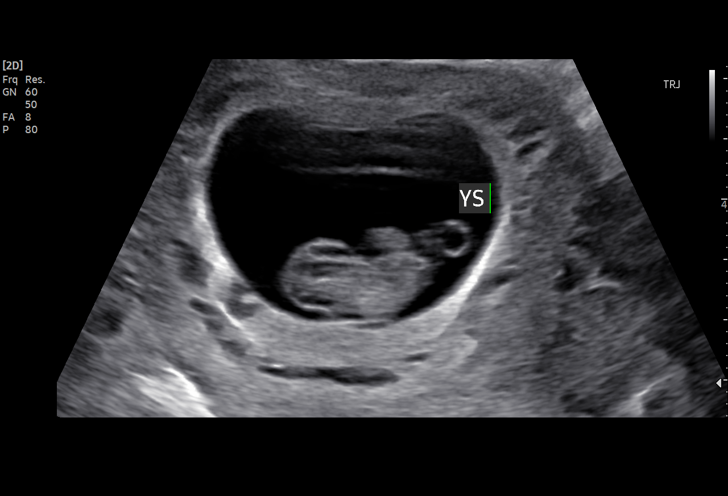
[im 31/70]
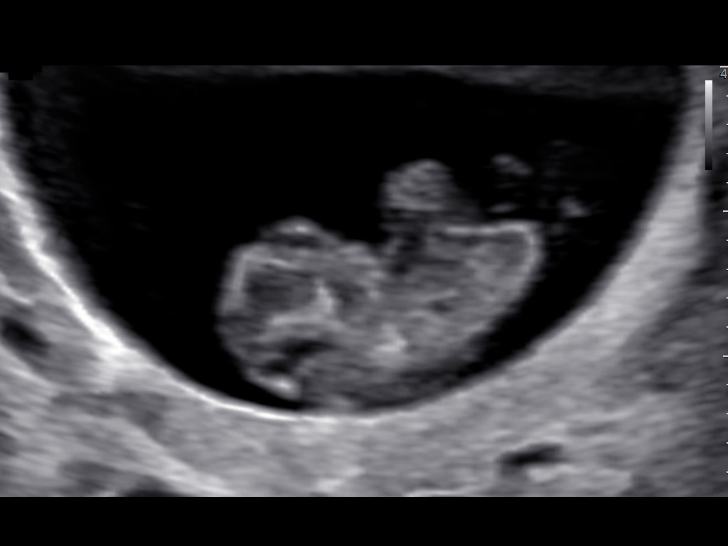
[im 36/70]
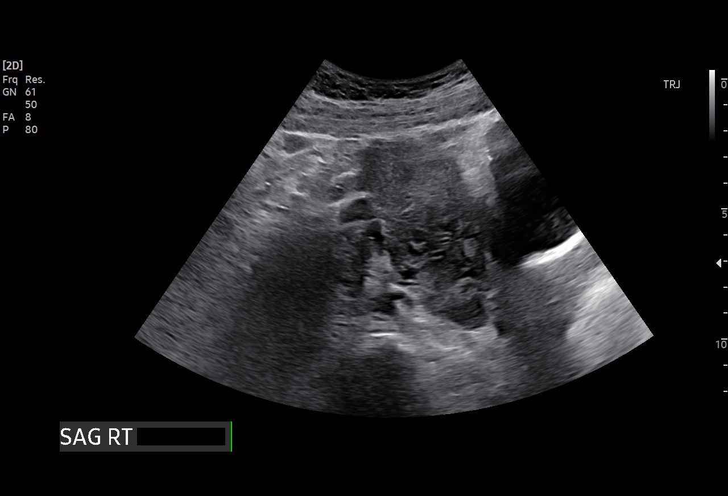
[im 39/70]
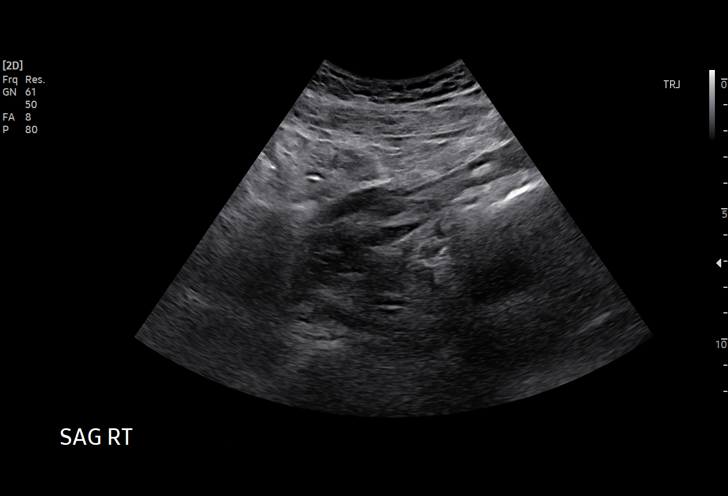
[im 44/70]
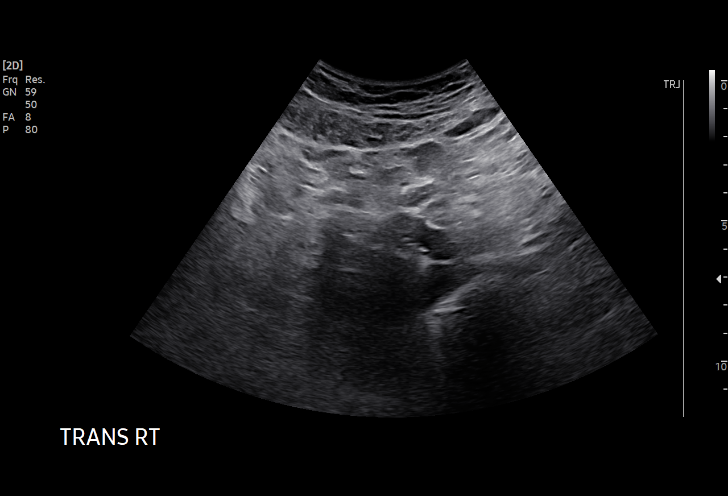
[im 49/70]
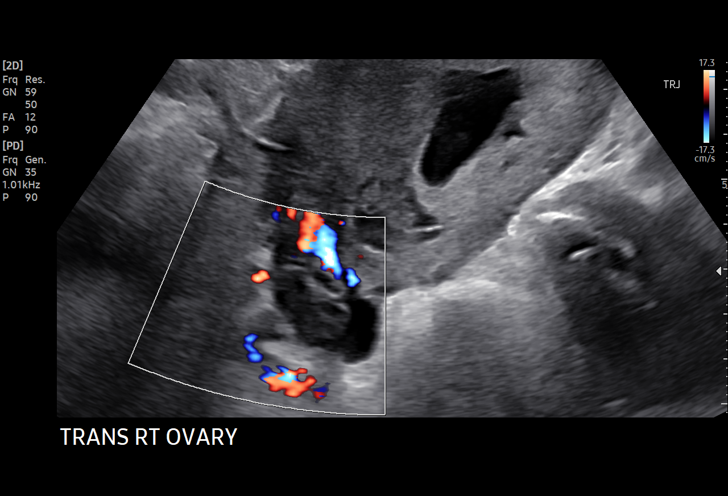
[im 54/70]
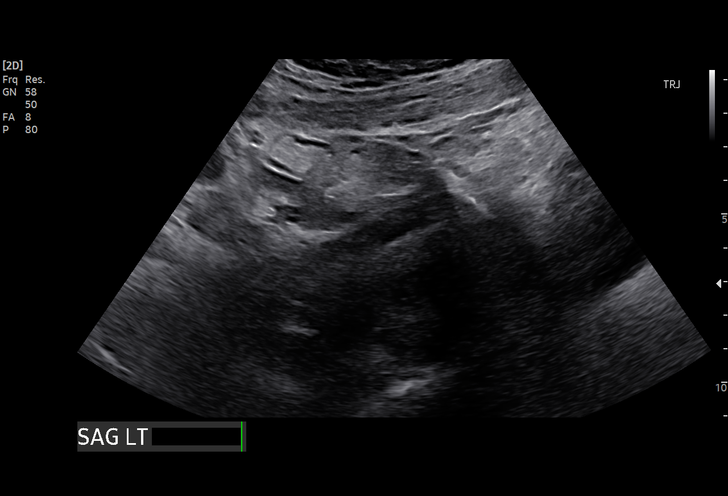
[im 59/70]
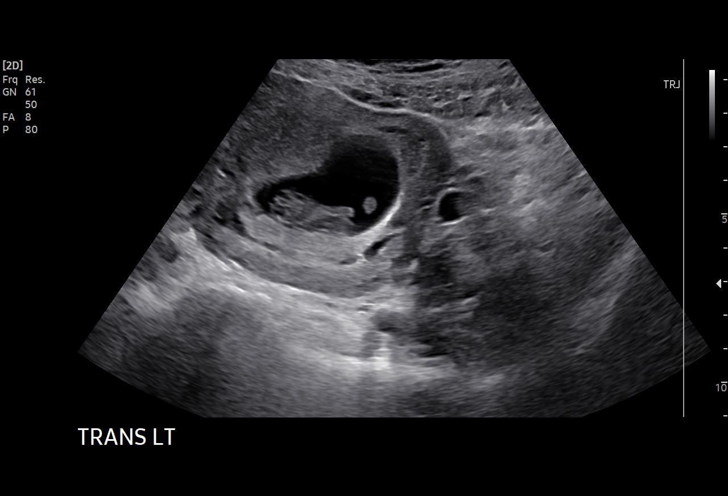
[im 64/70]
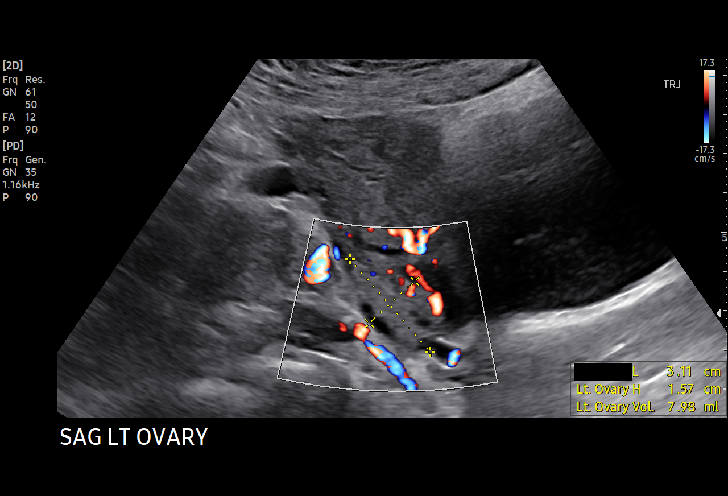
[im 70/70]
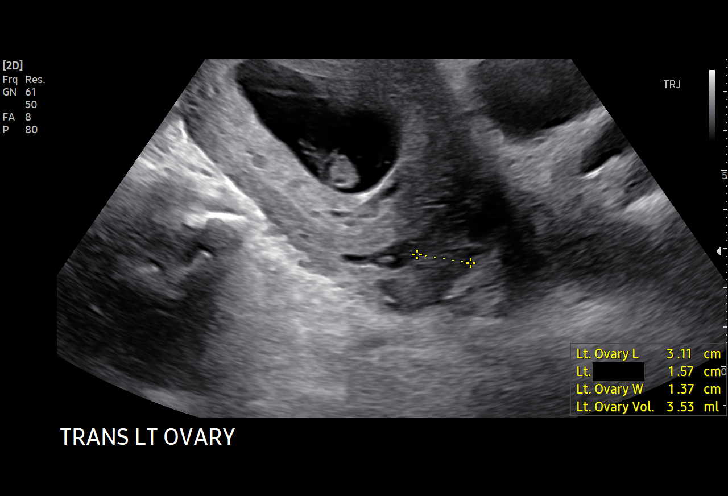

[15 of 28 positions shown; findings below may reference images not displayed]

FINDINGS: Intrauterine gestational sac: Present, single

Yolk sac:  Present

Embryo:  Present

Cardiac Activity: Present

Heart Rate: 176 bpm

CRL:   23.0 mm   9 w 0 d                  US EDC: 10/28/2021

Subchorionic hemorrhage:  None visualized.

Maternal uterus/adnexae:

Maternal uterus otherwise unremarkable.

RIGHT ovary normal size and morphology, 4.2 x 1.3 x 2.0 cm.

LEFT ovary normal size and morphology, 3.1 x 1.6 x 1.4 cm.

No free pelvic fluid or adnexal masses.
IMPRESSION: Single live intrauterine gestation at 9 weeks 0 days EGA by
crown-rump length.

No acute abnormalities.

## 2023-03-23 ENCOUNTER — Ambulatory Visit: Payer: Medicaid Other | Admitting: Obstetrics and Gynecology

## 2023-03-23 ENCOUNTER — Encounter: Payer: Self-pay | Admitting: Obstetrics and Gynecology

## 2023-03-23 VITALS — BP 125/87 | HR 68 | Ht 64.0 in | Wt 170.0 lb

## 2023-03-23 DIAGNOSIS — Z01419 Encounter for gynecological examination (general) (routine) without abnormal findings: Secondary | ICD-10-CM | POA: Diagnosis not present

## 2023-03-23 NOTE — Progress Notes (Signed)
ANNUAL EXAM Patient name: Kathryn Mack MRN [id number]  Date of birth: 12-23-1985 Chief Complaint:   Annual Exam  History of Present Illness:   Kathryn Mack is a 37 y.o. [phone] with Patient's last menstrual period was 03/10/2023. being seen today for a routine annual exam.  Current complaints: None   Upstream - 03/23/23 1411       Pregnancy Intention Screening   Does the patient want to become pregnant in the next year? No    Does the patient's partner want to become pregnant in the next year? No    Would the patient like to discuss contraceptive options today? No      Contraception Wrap Up   Current Method Vasectomy    End Method Vasectomy    Contraception Counseling Provided No    How was the end contraceptive method provided? N/A            The pregnancy intention screening data noted above was reviewed. Potential methods of contraception were discussed. The patient elected to proceed with Vasectomy.   Last pap 11/26/21. Results were: NILM w/ HRHPV negative. H/O abnormal pap: yes hx LEEP in her 72s Last mammogram: n/a. Results were: N/A. Family h/o breast cancer: no Last colonoscopy: n/a. Results were: N/A. Family h/o colorectal cancer: no HPV vaccine: no     04/04/2021    2:25 PM  Depression screen PHQ 2/9  Decreased Interest 1  Down, Depressed, Hopeless 0  PHQ - 2 Score 1  Altered sleeping 0  Tired, decreased energy 1  Change in appetite 0  Feeling bad or failure about yourself  0  Trouble concentrating 0  Moving slowly or fidgety/restless 0  Suicidal thoughts 0  PHQ-9 Score 2  Difficult doing work/chores Not difficult at all      04/04/2021    2:26 PM  GAD 7 : Generalized Anxiety Score  Nervous, Anxious, on Edge 0  Control/stop worrying 0  Worry too much - different things 0  Trouble relaxing 0  Restless 0  Easily annoyed or irritable 1  Afraid - awful might happen 0  Total GAD 7 Score 1  Anxiety Difficulty Not difficult at all   Review of  Systems:   Pertinent items are noted in HPI Denies any headaches, blurred vision, fatigue, shortness of breath, chest pain, abdominal pain, abnormal vaginal discharge/itching/odor/irritation, problems with periods, bowel movements, urination, or intercourse unless otherwise stated above. Pertinent History Reviewed:  Reviewed past medical,surgical, social and family history.  Reviewed problem list, medications and allergies. Physical Assessment:   Vitals:   03/23/23 1357  BP: 125/87  Pulse: 68  Weight: 170 lb (77.1 kg)  Height: 5\' 4"  (1.626 m)  Body mass index is 29.18 kg/m.        Physical Examination:   General appearance - well appearing, and in no distress  Mental status - alert, oriented to person, place, and time  Chest - respiratory effort normal  Heart - normal peripheral perfusion  Breasts - breasts appear normal, no suspicious masses, no skin or nipple changes or axillary nodes  Abdomen - soft, nontender, nondistended, no masses or organomegaly  Pelvic - VULVA: normal appearing vulva with no masses, tenderness or lesions  VAGINA: +R sided gartner's duct cyst vs bartholin's cyst stable and asymptomatic, otherwise normal appearing vagina with normal color and discharge, no lesions  CERVIX: no CMT  UTERUS: uterus is felt to be normal size, shape, consistency and nontender   ADNEXA: No adnexal masses or tenderness  noted.  Chaperone present for exam  No results found for this or any previous visit (from the past 24 hour(s)).  Assessment & Plan:  1) Well-Woman Exam Mammogram: @ 37yo, or sooner if problems Colonoscopy: @ 37yo, or sooner if problems Pap: Due 2026 Gardasil: Declines GC/CT: Declines HIV/HCV: Declines Vasectomy for contraception  Labs/procedures today: none  Meds: none  Follow-up: Return in about 1 year (around 03/22/2024) for annual exam.  Lennart Pall, MD 03/23/2023 2:21 PM

## 2024-06-21 ENCOUNTER — Telehealth: Payer: Self-pay | Admitting: *Deleted

## 2024-06-21 NOTE — Telephone Encounter (Signed)
Left patient a message to call and schedule annual.
